# Patient Record
Sex: Female | Born: 1955 | Race: White | Hispanic: No | Smoking: Former smoker
Health system: Southern US, Community
[De-identification: ages and names within clinical notes are randomized; demographics above are authoritative.]

## PROBLEM LIST (undated history)

## (undated) DIAGNOSIS — M1990 Unspecified osteoarthritis, unspecified site: Secondary | ICD-10-CM

## (undated) DIAGNOSIS — M549 Dorsalgia, unspecified: Secondary | ICD-10-CM

## (undated) DIAGNOSIS — M62838 Other muscle spasm: Secondary | ICD-10-CM

## (undated) DIAGNOSIS — Z8669 Personal history of other diseases of the nervous system and sense organs: Secondary | ICD-10-CM

## (undated) DIAGNOSIS — E785 Hyperlipidemia, unspecified: Secondary | ICD-10-CM

## (undated) DIAGNOSIS — M2550 Pain in unspecified joint: Secondary | ICD-10-CM

## (undated) DIAGNOSIS — E119 Type 2 diabetes mellitus without complications: Secondary | ICD-10-CM

## (undated) DIAGNOSIS — R0981 Nasal congestion: Secondary | ICD-10-CM

## (undated) DIAGNOSIS — E039 Hypothyroidism, unspecified: Secondary | ICD-10-CM

## (undated) DIAGNOSIS — Z860100 Personal history of colon polyps, unspecified: Secondary | ICD-10-CM

## (undated) DIAGNOSIS — F419 Anxiety disorder, unspecified: Secondary | ICD-10-CM

## (undated) DIAGNOSIS — K219 Gastro-esophageal reflux disease without esophagitis: Secondary | ICD-10-CM

## (undated) DIAGNOSIS — F32A Depression, unspecified: Secondary | ICD-10-CM

## (undated) DIAGNOSIS — B379 Candidiasis, unspecified: Secondary | ICD-10-CM

## (undated) DIAGNOSIS — I10 Essential (primary) hypertension: Secondary | ICD-10-CM

## (undated) DIAGNOSIS — D649 Anemia, unspecified: Secondary | ICD-10-CM

## (undated) DIAGNOSIS — G8929 Other chronic pain: Secondary | ICD-10-CM

## (undated) DIAGNOSIS — K5900 Constipation, unspecified: Secondary | ICD-10-CM

## (undated) DIAGNOSIS — T8859XA Other complications of anesthesia, initial encounter: Secondary | ICD-10-CM

## (undated) DIAGNOSIS — K5790 Diverticulosis of intestine, part unspecified, without perforation or abscess without bleeding: Secondary | ICD-10-CM

## (undated) DIAGNOSIS — M797 Fibromyalgia: Secondary | ICD-10-CM

## (undated) DIAGNOSIS — F329 Major depressive disorder, single episode, unspecified: Secondary | ICD-10-CM

## (undated) DIAGNOSIS — T4145XA Adverse effect of unspecified anesthetic, initial encounter: Secondary | ICD-10-CM

## (undated) HISTORY — PX: TONSILLECTOMY: SUR1361

## (undated) HISTORY — PX: BREAST SURGERY: SHX581

## (undated) HISTORY — PX: APPENDECTOMY: SHX54

## (undated) HISTORY — PX: BACK SURGERY: SHX140

## (undated) HISTORY — PX: CARPAL TUNNEL RELEASE: SHX101

## (undated) HISTORY — PX: EXCISION MORTON'S NEUROMA: SHX5013

## (undated) HISTORY — PX: ABDOMINAL EXPLORATION SURGERY: SHX538

## (undated) HISTORY — PX: KNEE ARTHROSCOPY: SUR90

## (undated) HISTORY — PX: NASAL SINUS SURGERY: SHX719

## (undated) HISTORY — PX: COLONOSCOPY: SHX174

## (undated) HISTORY — PX: JOINT REPLACEMENT: SHX530

## (undated) HISTORY — PX: OTHER SURGICAL HISTORY: SHX169

## (undated) HISTORY — PX: ESOPHAGOGASTRODUODENOSCOPY: SHX1529

---

## 1998-12-30 ENCOUNTER — Other Ambulatory Visit: Admission: RE | Admit: 1998-12-30 | Discharge: 1998-12-30 | Payer: Self-pay | Admitting: Family Medicine

## 2002-01-22 ENCOUNTER — Ambulatory Visit (HOSPITAL_BASED_OUTPATIENT_CLINIC_OR_DEPARTMENT_OTHER): Admission: RE | Admit: 2002-01-22 | Discharge: 2002-01-22 | Payer: Self-pay | Admitting: Orthopedic Surgery

## 2002-12-30 ENCOUNTER — Encounter: Admission: RE | Admit: 2002-12-30 | Discharge: 2002-12-30 | Payer: Self-pay | Admitting: Neurosurgery

## 2002-12-30 ENCOUNTER — Encounter: Admission: RE | Admit: 2002-12-30 | Discharge: 2002-12-30 | Payer: Self-pay | Admitting: Orthopedic Surgery

## 2004-03-10 ENCOUNTER — Ambulatory Visit (HOSPITAL_COMMUNITY): Admission: RE | Admit: 2004-03-10 | Discharge: 2004-03-11 | Payer: Self-pay | Admitting: Orthopaedic Surgery

## 2007-02-03 ENCOUNTER — Encounter: Admission: RE | Admit: 2007-02-03 | Discharge: 2007-02-03 | Payer: Self-pay | Admitting: Orthopaedic Surgery

## 2010-02-05 ENCOUNTER — Inpatient Hospital Stay (HOSPITAL_COMMUNITY)
Admission: RE | Admit: 2010-02-05 | Discharge: 2010-02-08 | Payer: Self-pay | Source: Home / Self Care | Attending: Orthopaedic Surgery | Admitting: Orthopaedic Surgery

## 2010-02-08 LAB — PROTIME-INR
INR: 0.99 (ref 0.00–1.49)
INR: 1.02 (ref 0.00–1.49)
INR: 1.67 — ABNORMAL HIGH (ref 0.00–1.49)
Prothrombin Time: 13.3 seconds (ref 11.6–15.2)
Prothrombin Time: 13.6 seconds (ref 11.6–15.2)
Prothrombin Time: 19.9 seconds — ABNORMAL HIGH (ref 11.6–15.2)

## 2010-02-08 LAB — CBC
HCT: 41.7 % (ref 36.0–46.0)
HCT: 37.7 % (ref 36.0–46.0)
HCT: 35.5 % — ABNORMAL LOW (ref 36.0–46.0)
Hemoglobin: 14.2 g/dL (ref 12.0–15.0)
Hemoglobin: 12.3 g/dL (ref 12.0–15.0)
Hemoglobin: 11.6 g/dL — ABNORMAL LOW (ref 12.0–15.0)
MCH: 31.1 pg (ref 26.0–34.0)
MCH: 30.4 pg (ref 26.0–34.0)
MCH: 30.6 pg (ref 26.0–34.0)
MCHC: 34.1 g/dL (ref 30.0–36.0)
MCHC: 32.6 g/dL (ref 30.0–36.0)
MCHC: 32.7 g/dL (ref 30.0–36.0)
MCV: 91.2 fL (ref 78.0–100.0)
MCV: 93.1 fL (ref 78.0–100.0)
MCV: 93.7 fL (ref 78.0–100.0)
Platelets: 269 10*3/uL (ref 150–400)
Platelets: 240 10*3/uL (ref 150–400)
Platelets: 241 10*3/uL (ref 150–400)
RBC: 4.57 MIL/uL (ref 3.87–5.11)
RBC: 4.05 MIL/uL (ref 3.87–5.11)
RBC: 3.79 MIL/uL — ABNORMAL LOW (ref 3.87–5.11)
RDW: 13.4 % (ref 11.5–15.5)
RDW: 13.6 % (ref 11.5–15.5)
RDW: 13.4 % (ref 11.5–15.5)
WBC: 12 10*3/uL — ABNORMAL HIGH (ref 4.0–10.5)
WBC: 13.3 10*3/uL — ABNORMAL HIGH (ref 4.0–10.5)
WBC: 13.3 10*3/uL — ABNORMAL HIGH (ref 4.0–10.5)

## 2010-02-08 LAB — COMPREHENSIVE METABOLIC PANEL
ALT: 40 U/L — ABNORMAL HIGH (ref 0–35)
AST: 34 U/L (ref 0–37)
Albumin: 4.1 g/dL (ref 3.5–5.2)
Alkaline Phosphatase: 70 U/L (ref 39–117)
BUN: 7 mg/dL (ref 6–23)
CO2: 30 mEq/L (ref 19–32)
Calcium: 9.8 mg/dL (ref 8.4–10.5)
Chloride: 104 mEq/L (ref 96–112)
Creatinine, Ser: 0.72 mg/dL (ref 0.4–1.2)
GFR calc Af Amer: >60 mL/min (ref >60)
GFR calc non Af Amer: >60 mL/min (ref >60)
Glucose, Bld: 88 mg/dL (ref 70–99)
Potassium: 4.4 mEq/L (ref 3.5–5.1)
Sodium: 142 mEq/L (ref 135–145)
Total Bilirubin: 0.5 mg/dL (ref 0.3–1.2)
Total Protein: 6.7 g/dL (ref 6.0–8.3)

## 2010-02-08 LAB — BASIC METABOLIC PANEL
BUN: 6 mg/dL (ref 6–23)
BUN: 5 mg/dL — ABNORMAL LOW (ref 6–23)
CO2: 25 mEq/L (ref 19–32)
CO2: 32 mEq/L (ref 19–32)
Calcium: 8.7 mg/dL (ref 8.4–10.5)
Calcium: 9.1 mg/dL (ref 8.4–10.5)
Chloride: 103 mEq/L (ref 96–112)
Chloride: 103 mEq/L (ref 96–112)
Creatinine, Ser: 0.68 mg/dL (ref 0.4–1.2)
Creatinine, Ser: 0.7 mg/dL (ref 0.4–1.2)
GFR calc Af Amer: >60 mL/min (ref >60)
GFR calc Af Amer: >60 mL/min (ref >60)
GFR calc non Af Amer: >60 mL/min (ref >60)
GFR calc non Af Amer: >60 mL/min (ref >60)
Glucose, Bld: 137 mg/dL — ABNORMAL HIGH (ref 70–99)
Glucose, Bld: 120 mg/dL — ABNORMAL HIGH (ref 70–99)
Potassium: 4.2 mEq/L (ref 3.5–5.1)
Potassium: 4.1 mEq/L (ref 3.5–5.1)
Sodium: 137 mEq/L (ref 135–145)
Sodium: 141 mEq/L (ref 135–145)

## 2010-02-08 LAB — SURGICAL PCR SCREEN
MRSA, PCR: NEGATIVE
Staphylococcus aureus: NEGATIVE

## 2010-02-10 LAB — GLUCOSE, CAPILLARY
Glucose-Capillary: 134 mg/dL — ABNORMAL HIGH (ref 70–99)
Glucose-Capillary: 125 mg/dL — ABNORMAL HIGH (ref 70–99)

## 2010-02-10 LAB — PROTIME-INR
INR: 2.45 — ABNORMAL HIGH (ref 0.00–1.49)
Prothrombin Time: 26.7 seconds — ABNORMAL HIGH (ref 11.6–15.2)

## 2010-03-09 NOTE — Discharge Summary (Signed)
[name], [name]              ACCOUNT NO.:  [id number]  MEDICAL RECORD NO.:  [id number]          PATIENT TYPE:  INP  LOCATION:  5032                         FACILITY:  [facility]  PHYSICIAN:  Deandrea Rion C. [name], M.D.    DATE OF BIRTH:  12-14-1955  DATE OF ADMISSION:  02/05/2010 DATE OF DISCHARGE:  02/08/2010                              DISCHARGE SUMMARY   ADMISSION DIAGNOSES: 1. Right knee osteoarthritis with valgus deformity. 2. History of migraine headaches. 3. Sleep apnea. 4. Gastroesophageal reflux disease.  DISCHARGE DIAGNOSES: 1. Right knee osteoarthritis with valgus deformity. 2. History of migraine headaches. 3. Sleep apnea. 4. Gastroesophageal reflux disease. 5. Posthemorrhagic anemia.  PROCEDURE:  On February 05, 2010, the patient underwent right total knee arthroplasty, cemented, computer-assisted, performed by Dr. [name], assisted by Maud Deed, PA-C, under general anesthesia.  CONSULTATIONS:  None. BRIEF HISTORY:  The patient is a 55 year old female with chronic and progressive right knee pain secondary to osteoarthritis.  She has failed conservative treatment including anti-inflammatory medications, intra- articular steroid injections, as well as viscosupplementation.  It was felt she would benefit from surgical intervention.  Radiographs did show tricompartmental changes of the right knee, and the patient was noted to have a valgus deformity on physical examination.  BRIEF HOSPITAL COURSE:  The patient was admitted and underwent the procedure as stated above.  She tolerated the procedure without complications.  Postoperatively, neurovascular motor function of the lower extremities was noted to be intact.  Dressing change was done on the second postoperative day and daily after that with no wound drainage.  The patient was placed on Coumadin for DVT prophylaxis postoperatively.  Adjustments in Coumadin dose made according to daily protimes by the pharmacist.   Physical therapy was initiated for range of motion, stretching, strengthening exercises.  She was allowed weightbearing as tolerated on the operative extremity.  The patient was able to progress well with her activity.  PCA analgesics were utilized initially and she was weaned to p.o. analgesics without difficulty.  The patient was voiding well following discontinuation of her Foley catheter.  The patient was taking a regular diet prior to discharge.  PERTINENT LABORATORY VALUES:  Admission CBC with hemoglobin 14.2, hematocrit 41.7.  Hemoglobin dropped to 11.6 with hematocrit 35.5.  INR at discharge 2.45.  Chemistry studies on admission within normal limits. Repeat postoperatively also within normal limits.  PCR screening is negative, both for MRSA and staph.  PLAN:  Arrangements made for the patient to be discharged to home with home health physical therapy assistance through [facility].  She will change her dressing as needed and keep her wound dry and covered at all times.  Knee immobilizer to be utilized for ambulation.  She will use over-the-counter stool softeners daily while taking narcotic medications.  She will continue to use a walker and all durable medical equipment was made available to the patient prior to discharge.  She will follow up with Dr. [name] 2 weeks from the date of surgery.  MEDICATIONS AT DISCHARGE:  The patient was given prescription for Coumadin per Pharmacy, Robaxin 500 mg one every 6-8 hours as needed for spasm, Percocet  5/325 one to two every 4-6 hours as needed for pain, oxycodone 20 mg p.o. b.i.d.  She will resume all home medications as taken prior to admission.  The patient was instructed to call the office should she have questions or concerns prior to her return office visit.  CONDITION ON DISCHARGE:  Stable.     Wende Neighbors, P.A.   ______________________________ Veverly Fells [name], M.D.    SMV/MEDQ  D:  02/24/2010  T:  02/25/2010  Job:   [id number]  Electronically Signed by Maud Deed P.A. on 02/25/2010 12:20:14 PM Electronically Signed by Annell Greening M.D. on 03/09/2010 04:56:11 PM

## 2010-06-11 NOTE — Op Note (Signed)
[name], [name]                          ACCOUNT NO.:  [id number]   MEDICAL RECORD NO.:  [id number]                   PATIENT TYPE:  AMB   LOCATION:  DSC                                  FACILITY:  [facility]   PHYSICIAN:  Katy Fitch. Naaman Plummer., M.D.          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   OPERATION:  1. Release of right transverse carpal ligament, 64721-RT.  2. Release of left transverse carpal ligament, 64721-LT.  Note that these are separate procedures through separate incisions.   SURGEON:  Katy Fitch. Sypher, M.D.   ASSISTANT:  Jonni Sanger, P.A.-C.   ANESTHESIA:  General by LMA, supervising anesthesiologist, Bedelia Person, M.D.   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 Clinical examination  revealed signs of median entrapment neuropathy, consistent with carpal  tunnel syndrome.   Due to a failure to respond to nonoperative measures and with documentation  of bilateral positive nerve conduction studies, she was brought to the  operating room at this time for release of her right and left transverse  carpal ligaments.   DESCRIPTION OF PROCEDURE:  The patient was brought to the operating room and  placed in the supine position upon the operating table. Following induction  of general anesthesia, the right and left arms were prepped with Betadine  soap and solution and sterilely draped. Pneumatic tourniquets were applied  to the  proximal forearm. Following exsanguination of the right arm with an  Esmarch bandage the arterial tourniquet was inflated to 220 mmHg.   The procedure commenced with a short incision in the line of the ring finger  and the palm. The subcutaneous tissues were carefully divided in the normal  palmar fashion. This was split longitudinally through the common sensory  branch of the median nerve. These were followed by to the transverse carpal  ligament which was carefully separated from the median nerve.   The ligament was isolated and split along its ulnar border, extending into  the distal forearm. This widely opened the carpal canal. No masses or other  predicaments were noted other than thickening of the ulnar bursa.   The wound was then repaired with intradermal 3-0 Prolene. Then 0.25%  Marcaine was infiltrated along the margins of the wound for postoperative  analgesia followed by dressing with a Steri-Strip, sterile gauze, sterile  Webril and a volar plaster splint to main the wrist in 5 degrees of  dorsiflexion. The tourniquet  was released on the right side with immediate  capillary refill.   Attention was then directed to the left hand, where an identical procedure  was performed with exsanguination followed by the inflation of the arterial  tourniquet to 220 mmHg. A short incision was fashioned in the line of the  ring finger and the palm, followed  by identification of the common sensory  branches in the palm.   The transverse carpal ligament was isolated and split with scissors along  its ulnar border, extending into the distal forearm. This widely opened the  carpal canal. Once again there were no masses or other predicaments.   Bleeding points along the margin of the released skin and transverse carpal  ligament were electrocauterized with bipolar current, followed by repair  of  the skin with intradermal 3-0 Prolene. A compressive dressing was applied  once again with  Steri-Strip, sterile gauze, sterile Webril and a volar  plaster splint.   The patient was awakened from anesthesia and transferred to the recovery  room in stable condition. For aftercare she was given a prescription for  Percocet 5/325 1 to 2 tablets p.o. q.4-6h. p.r.n. pain, #20 tablets without  refill. She has been instructed to keep her hands elevated and her dressings  dry. She will return to the hospital office for a followup in 7 to 10 days  for a dressing change and advancement to her exercise program.                                               Katy Fitch. Naaman Plummer., M.D.    RVS/MEDQ  D:  01/22/2002  T:  01/22/2002  Job:  [id number]

## 2010-06-11 NOTE — Op Note (Signed)
NAMEBONNEY, BERRES NO.:  [id number]   MEDICAL RECORD NO.:  [id number]          PATIENT TYPE:  OIB   LOCATION:  2867                         FACILITY:  [facility]   PHYSICIAN:  Mark C. [name], M.D.    DATE OF BIRTH:  April 02, 1955   DATE OF PROCEDURE:  03/10/2004  DATE OF DISCHARGE:                                 OPERATIVE REPORT   PREOPERATIVE DIAGNOSIS:  Left L5-S1 HNP.   POSTOPERATIVE DIAGNOSIS:  Left L5-S1 HNP.   PROCEDURE:  Left L5-S1 microdiskectomy.   SURGEON:  Mark C. [name], M.D.   ANESTHESIA:  GOT.   ESTIMATED BLOOD LOSS:  150 mL.   DESCRIPTION OF PROCEDURE:  After induction of general anesthesia with  orotracheal intubation and preoperative Ancef prophylaxis, the patient was  placed on chest rolls in the prone position.  Back was prepped with  Duraprep, the area squared with towels, Betadine Vi-drape applied and  laminectomy sheet.  Crosstable lateral x-ray confirmed the L5-S1 level.  After left L5 laminotomy, there was some overhanging spurs off the facet  that were removed out to the level of the pedicle.  The nerve root was  displaced dorsally and laterally and underneath it was a combination of  fresh disk material as well as old calcified disk material.  It was  difficult to remove the calcified disk pieces since it was so large,  pressing on the nerve root, and had to gradually be removed from underneath  at the base, nibbling away in order to remove the pieces until it could be  separated from the undersurface of the nerve root and then removed.  The  patient had history of some severe back problems two to three years ago and  had some fresh disk material immediately underneath it which was pressing  the calcified disk fragment up against the nerve.  Continued dissection was  performed using a combination of regular hockey stick and long hockey stick.  The footed impacter with mallet and the Epstein curets.  Chunks of disk were  gently removed  until nerve root was completely decompressed.  With palpation  with the long hockey stick, there was still some calcified disk material on  the opposite side, but the patient had unilateral symptoms on the left.  After thorough irrigation, the dura was intact.  Operative field was  irrigated.  The fascia was closed with 0 Vicryl, 2-0 in the subcutaneous  tissue, skin stapled closure, and postoperative dressing.  The patient  tolerated the procedure well and was transferred to the recovery room in  stable condition.      MCY/MEDQ  D:  03/10/2004  T:  03/10/2004  Job:  [id number]

## 2010-08-31 ENCOUNTER — Encounter (HOSPITAL_COMMUNITY)
Admission: RE | Admit: 2010-08-31 | Discharge: 2010-08-31 | Disposition: A | Discharge status: Home / Self Care | Payer: BC Managed Care – PPO | Source: Ambulatory Visit | Attending: Orthopaedic Surgery | Admitting: Orthopaedic Surgery

## 2010-08-31 LAB — URINALYSIS, ROUTINE W REFLEX MICROSCOPIC
Bilirubin Urine: NEGATIVE
Glucose, UA: NEGATIVE mg/dL
Hgb urine dipstick: NEGATIVE
Ketones, ur: NEGATIVE mg/dL
Leukocytes, UA: NEGATIVE
Nitrite: NEGATIVE
Protein, ur: NEGATIVE mg/dL
Specific Gravity, Urine: 1.022 (ref 1.005–1.030)
Urobilinogen, UA: 1 mg/dL (ref 0.0–1.0)
pH: 5.5 (ref 5.0–8.0)

## 2010-08-31 LAB — COMPREHENSIVE METABOLIC PANEL
ALT: 17 U/L (ref 0–35)
AST: 17 U/L (ref 0–37)
Albumin: 3.9 g/dL (ref 3.5–5.2)
Alkaline Phosphatase: 85 U/L (ref 39–117)
BUN: 8 mg/dL (ref 6–23)
CO2: 28 mEq/L (ref 19–32)
Calcium: 9.6 mg/dL (ref 8.4–10.5)
Chloride: 105 mEq/L (ref 96–112)
Creatinine, Ser: 0.63 mg/dL (ref 0.50–1.10)
GFR calc Af Amer: >60 mL/min (ref >60)
GFR calc non Af Amer: >60 mL/min (ref >60)
Glucose, Bld: 85 mg/dL (ref 70–99)
Potassium: 4.2 mEq/L (ref 3.5–5.1)
Sodium: 143 mEq/L (ref 135–145)
Total Bilirubin: 0.2 mg/dL — ABNORMAL LOW (ref 0.3–1.2)
Total Protein: 7.3 g/dL (ref 6.0–8.3)

## 2010-08-31 LAB — CBC
HCT: 38.4 % (ref 36.0–46.0)
Hemoglobin: 13.1 g/dL (ref 12.0–15.0)
MCH: 30.8 pg (ref 26.0–34.0)
MCHC: 34.1 g/dL (ref 30.0–36.0)
MCV: 90.1 fL (ref 78.0–100.0)
Platelets: 297 10*3/uL (ref 150–400)
RBC: 4.26 MIL/uL (ref 3.87–5.11)
RDW: 13.8 % (ref 11.5–15.5)
WBC: 10.9 10*3/uL — ABNORMAL HIGH (ref 4.0–10.5)

## 2010-08-31 LAB — SEDIMENTATION RATE: Sed Rate: 22 mm/hr (ref 0–22)

## 2010-08-31 LAB — PROTIME-INR
INR: 1.01 (ref 0.00–1.49)
Prothrombin Time: 13.5 seconds (ref 11.6–15.2)

## 2010-09-01 ENCOUNTER — Other Ambulatory Visit: Payer: Self-pay | Admitting: Orthopaedic Surgery

## 2010-09-01 ENCOUNTER — Inpatient Hospital Stay (HOSPITAL_COMMUNITY)
Admission: RE | Admit: 2010-09-01 | Discharge: 2010-09-03 | DRG: 559 | Disposition: A | Discharge status: Home Health | Payer: BC Managed Care – PPO | Source: Ambulatory Visit | Attending: Orthopaedic Surgery | Admitting: Orthopaedic Surgery

## 2010-09-01 DIAGNOSIS — F17200 Nicotine dependence, unspecified, uncomplicated: Secondary | ICD-10-CM | POA: Diagnosis present

## 2010-09-01 DIAGNOSIS — J4489 Other specified chronic obstructive pulmonary disease: Secondary | ICD-10-CM | POA: Diagnosis present

## 2010-09-01 DIAGNOSIS — Z01812 Encounter for preprocedural laboratory examination: Secondary | ICD-10-CM

## 2010-09-01 DIAGNOSIS — Z96659 Presence of unspecified artificial knee joint: Secondary | ICD-10-CM

## 2010-09-01 DIAGNOSIS — K219 Gastro-esophageal reflux disease without esophagitis: Secondary | ICD-10-CM | POA: Diagnosis present

## 2010-09-01 DIAGNOSIS — G4730 Sleep apnea, unspecified: Secondary | ICD-10-CM | POA: Diagnosis present

## 2010-09-01 DIAGNOSIS — Y831 Surgical operation with implant of artificial internal device as the cause of abnormal reaction of the patient, or of later complication, without mention of misadventure at the time of the procedure: Secondary | ICD-10-CM | POA: Diagnosis present

## 2010-09-01 DIAGNOSIS — M009 Pyogenic arthritis, unspecified: Secondary | ICD-10-CM | POA: Diagnosis present

## 2010-09-01 DIAGNOSIS — Z79899 Other long term (current) drug therapy: Secondary | ICD-10-CM

## 2010-09-01 DIAGNOSIS — J449 Chronic obstructive pulmonary disease, unspecified: Secondary | ICD-10-CM | POA: Diagnosis present

## 2010-09-01 DIAGNOSIS — T8450XA Infection and inflammatory reaction due to unspecified internal joint prosthesis, initial encounter: Principal | ICD-10-CM | POA: Diagnosis present

## 2010-09-01 DIAGNOSIS — F411 Generalized anxiety disorder: Secondary | ICD-10-CM | POA: Diagnosis present

## 2010-09-01 LAB — C-REACTIVE PROTEIN: CRP: 0.6 mg/dL — ABNORMAL HIGH (ref <0.6)

## 2010-09-02 DIAGNOSIS — Y831 Surgical operation with implant of artificial internal device as the cause of abnormal reaction of the patient, or of later complication, without mention of misadventure at the time of the procedure: Secondary | ICD-10-CM

## 2010-09-02 DIAGNOSIS — T8450XA Infection and inflammatory reaction due to unspecified internal joint prosthesis, initial encounter: Secondary | ICD-10-CM

## 2010-09-03 LAB — HIV ANTIBODY (ROUTINE TESTING W REFLEX): HIV: NONREACTIVE

## 2010-09-03 LAB — CBC
HCT: 34.7 % — ABNORMAL LOW (ref 36.0–46.0)
MCH: 30.1 pg (ref 26.0–34.0)
MCHC: 32.6 g/dL (ref 30.0–36.0)
MCV: 92.5 fL (ref 78.0–100.0)
Platelets: 263 10*3/uL (ref 150–400)
RDW: 13.6 % (ref 11.5–15.5)
WBC: 10.5 10*3/uL (ref 4.0–10.5)

## 2010-09-03 LAB — BASIC METABOLIC PANEL
BUN: 6 mg/dL (ref 6–23)
Calcium: 8.8 mg/dL (ref 8.4–10.5)
Creatinine, Ser: 0.56 mg/dL (ref 0.50–1.10)
GFR calc Af Amer: >60 mL/min (ref >60)
GFR calc non Af Amer: >60 mL/min (ref >60)

## 2010-09-05 LAB — BODY FLUID CULTURE: Culture: NO GROWTH

## 2010-09-12 NOTE — Consult Note (Signed)
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  DISCUSSION:  For details, please see the handwritten note in the paper chart as written by my resident, Dr. Fayrene Fearing.  I have examined the electronic paper medical record including the history of present illness, past medical history, past surgical history, family history, social history, medications, allergies, 12-point review of systems.  I have examined the patient.  I agreed with assessment and plan as outlined in his note with the following addendum:  Briefly, this is a 55 year old Caucasian female with a past medical history significant for migraine headaches, right knee osteoarthritis who underwent total knee arthroplasty on February 05, 2010, by Dr. [name]. Apparently, postoperatively, she complained of swelling and pain in the right knee and this persisted for many months.  She has had at least 2 attempts at knee aspiration, one which was a dry tap, but second one which had yielded some serosanguineous fluid which was sent off for analysis and culture.  The patient also reportedly did have a three- phase bone scan which lit up as well concerning for infection.  The patient ultimately brought to the hospital and underwent diagnostic and operative arthroscopy of the right knee under anesthesia on September 01, 2010.  Cultures were taken intraoperatively prior to antibiotics being given and antibiotics vancomycin beads were placed.  The patient was placed postoperatively on vancomycin and Zosyn.  We are asked to assist in management and workup of this patient with prosthetic knee  infection.  IMPRESSION AND RECOMMENDATIONS: 1. Prosthetic knee infection:  This patient presents with a mixture of     data, some of which suggests infection, some of which is not     conclusive.  I certainly would want to treat her for infection, but     she does not have gross purulent material in the joint.  Her sed     rate, C-reactive protein are not terribly remarkable.  I do have     access to the three-phase bone scan that was performed, but with     that being abnormal, with her persistent pain, I certainly would     treat her.  For now, I would give her more narrow antibiotic     therapy with vancomycin to treat, more difficult to culture the     pathogen such as coag-negative staph and diphtheroids.  We can     consider addition of other drugs such as a beta-lactam such as     Rocephin to cover for microaerophilic strep, although these species     would likely also be covered reasonably well with vancomycin.  I     will add rifampin given the presence of hardware still.  I would     treat her for 6 weeks with vancomycin and rifampin, presuming that  her cultures were all negative.  We will need also to touch base     with  however, as the initial aspirates were sent to     make sure these are not yet growing any organism.  Screening was     for human immunodeficiency virus. 2. Proton pump inhibitor use.  The patient has been on Protonix     chronically for acid reflux.  I have informed her     the risks of broad-spectrum antibiotics and risks of PPIs and she     would like to transition off the PPI for now and be on Pepcid,     which I have changed in the chart.  Thank you for Infectious Disease consultation.     Acey Lav, MD     CV/MEDQ  D:  09/02/2010  T:  09/03/2010  Job:  [id number]  Electronically Signed by Paulette Blanch DAM MD on 09/12/2010 11:32:57 AM

## 2010-09-13 NOTE — Op Note (Signed)
[name], LEHRMANN NO.:  [id number]  MEDICAL RECORD NO.:  [id number]  LOCATION:  5013                         FACILITY:  [facility]  PHYSICIAN:  Ronzell Laban C. [name], M.D.    DATE OF BIRTH:  1955-10-17  DATE OF PROCEDURE:  09/01/2010 DATE OF DISCHARGE:                              OPERATIVE REPORT   PREOPERATIVE DIAGNOSIS:  Presumptive right total knee arthroplasty postop infection.  POSTOPERATIVE DIAGNOSIS:  Presumptive right total knee arthroplasty postop infection.  PROCEDURES:  Diagnostic and operative arthroscopy, right knee exam under anesthesia, biopsies and arthroscopic synovectomy and placement of nonbiodegradable antibiotic beads, vancomycin with PALACOS cement.  SURGEON:  Jyla Hopf C. [name], MD  ANESTHESIA:  General.  TOURNIQUET TIME:  Thirty seven minutes under anesthesia plus Marcaine 20 mL local.  DESCRIPTION OF PROCEDURE:  After induction of general anesthesia, proximal thigh tourniquet, no preoperative antibiotics were given. Standard DuraPrep was used, the usual impervious stockinette, Coban, and arthroscopic sheets, drapes, and towels were applied.  Inflow was placed through the superolateral portal and before any inflow was made, a pituitary was introduced into the joint and the suprapatellar pouch and multiple bites were taken of the synovium and the suprapatellar pouch. Next, the knee was insufflated.  Medial and lateral parapatellar tendon portals were used for scope and probe placement, and inspection of the knee showed a white synovium.  There was absence of type of changes that would suggest chronic tenosynovitis.  The capsule did appear to be thickened and [place] 4.5 shaver was introduced and synovectomy was performed in the suprapatellar pouch, gutters anterior to the post. Inspection at the poly-tray interface was performed.  The soft tissues were debrided anterior to the tray.  Inspection of the bone arthroscopically appeared  normal.  Synovium was removed in the suprapatellar pouch directly above the cortex of the femur.  Again, inspection did not reveal any abnormalities.  The bone was probed with the arthroscopic probe and appeared normal.  There were some strands of fibrous tissue in the gutters which were debrided, but overall most difficult to say if this joint really looked like there were chronic synovitis changes present grossly.  The patient had had preoperative lab work performed with a CRP of 5.1 which is slightly elevated.  Her sed rate initially was 6 and at the time of admission, her sed rate had been repeated and it was 22, but her CRP on hospital range was 0.6 and white count had gone from 8000 to 10.9 thousand.  At the very beginning of the case, before introduction, after the skin had been anesthetized, a stab incision was made.  Needle was introduced to see if there was any significant fluid and after the trocar was introduced, there was minimal amount of bloody fluid, and aerobic culture was obtained at that point. No purulence was encountered with inspection of the knee or when portals were opened, medial and lateral parapatellar.  Synovectomy was performed in the gutters and suprapatellar pouch.  Knee was brought out and manipulated out into full extension to make sure that there was no soft tissue anteriorly that was blocking full extension.  There was no scar tissue around the post.  Collateral  ligaments were stable.  AP stability of the knee was good.  After synovectomy, a string of PALACOS and vancomycin beads were packed very small, so that they fit through the 8- mm cannula, packed in the suprapatellar pouch and then the tip was left running out through the knee superolaterally and a single nylon suture was placed in the skin, tied in above for later closure.  Instrument and needle count was correct.  A total of 31 beads were placed into the knee and knots were tied on the end of #2  Ethibond suture.     [name] C. [name], M.D.     MCY/MEDQ  D:  09/01/2010  T:  09/02/2010  Job:  [id number]  Electronically Signed by Annell Greening M.D. on 09/13/2010 04:47:34 PM

## 2010-10-14 NOTE — Discharge Summary (Signed)
[name], BRUNKHORST NO.:  [id number]  MEDICAL RECORD NO.:  [id number]  LOCATION:  5013                         FACILITY:  [facility]  PHYSICIAN:  Coretha Creswell C. [name], M.D.    DATE OF BIRTH:  1955/04/12  DATE OF ADMISSION:  09/01/2010 DATE OF DISCHARGE:  09/03/2010                              DISCHARGE SUMMARY   ADMISSION DIAGNOSIS:  Presumptive right total knee arthroplasty postop infection.  DISCHARGE DIAGNOSIS:  Right total knee arthroplasty postop infection, treated with IV vancomycin and p.o. Rocephin.  PROCEDURE:  On September 01, 2010, the patient underwent diagnostic and operative arthroscopy, right knee exam under anesthesia, biopsies and arthroscopic synovectomy with placement of non-biodegradable antibiotic beads, vancomycin with Palacos cement.  This was performed by Dr. [name] under general anesthesia.  CONSULTATIONS:  Infectious Disease Dr. Daiva Eves.  BRIEF HISTORY:  The patient is a 55 year old female status post right knee arthroplasty, August 06, 2010.  Postoperatively, she had recurring pain and swelling.  Attempts at knee aspiration showed dry tap x2.  On the day of admission, she had been seen at Dr. [name] office, at which time, small amount of serosanguinous fluid was obtained and sent for culture and sensitivity.  The patient also had a three-phase bone scan which was concerning for infection.  She was admitted to the hospital for the above-stated procedure.  BRIEF HOSPITAL COURSE:  The patient tolerated the procedure under general anesthesia.  She was placed on IV vancomycin.  PICC line was placed.  She was placed on p.o. Rocephin.  The patient was treated with postoperative analgesics including IV PCA and oral medications. Physical therapy was consulted for range of motion and strengthening exercises as well as gait training.  The antibiotic beads were pulled at bedside prior to discharge.  The patient had no signs or symptoms of wound breakdown.   She was discharged to home on September 03, 2010.  At that time, arrangements made for home health physical therapy.  Also arrangements for home health nurse for treatment with IV vancomycin. She is plan to be on vancomycin 6 weeks total from September 01, 2010.  PERTINENT LABORATORY VALUES:  WBC 10.5 on admission, hemoglobin 11.3, hematocrit 34.7 platelets 263.  Chemistry studies with glucose 127, otherwise normal.  HIV nonreactive.  Culture showed no growth in 3 days. Prior to admission, the patient's labs had shown sed rate of 6, CRP of 5.1.  PLAN:  The patient was discharged to her home.  Arrangements for home health physical therapy as well as a home health nurse.  She will change her dressing daily or as needed.  She should follow up with Dr. [name] in 1 week.  She will call if she has increased redness or pain around the knee or if she has elevated temperatures or fever or chills.  She may continue walking as tolerated and work on range of motion stretching and strengthening exercises for her knee.  MEDICATIONS AT DISCHARGE:  The patient was given prescriptions for Pepcid 20 mg p.o. daily, oxycodone 5/325 one to two every 4-6 hours as needed for pain, rifampin 300 mg p.o. b.i.d., vancomycin 1500 mg b.i.d. She will continue with home medications of Adderall  XR 15 mg daily, citalopram 40 mg daily, methocarbamol 500 mg every 6-8 hours as needed for spasm, Crestor 10 mg daily at bedtime, Astelin nasal spray twice daily as needed.  She will stop her Protonix as well as her hydrocodone. She may continue her over-the-counter grape seed extract.  She is advised to call Dr. [name] office should there be questions or concerns prior to her return office visit.  CONDITION ON DISCHARGE:  Stable.     Wende Neighbors, P.A.   ______________________________ Veverly Fells [name], M.D.    SMV/MEDQ  D:  10/07/2010  T:  10/07/2010  Job:  [id number]  Electronically Signed by Maud Deed P.A. on  10/08/2010 03:38:11 PM Electronically Signed by Annell Greening M.D. on 10/14/2010 04:20:46 PM

## 2013-04-26 ENCOUNTER — Other Ambulatory Visit: Payer: Self-pay | Admitting: Orthopedic Surgery

## 2013-05-06 ENCOUNTER — Encounter (HOSPITAL_COMMUNITY): Payer: Self-pay

## 2013-05-10 ENCOUNTER — Encounter (HOSPITAL_COMMUNITY)
Admission: RE | Admit: 2013-05-10 | Discharge: 2013-05-10 | Disposition: A | Discharge status: Home / Self Care | Payer: BC Managed Care – PPO | Source: Ambulatory Visit | Attending: Orthopedic Surgery | Admitting: Orthopedic Surgery

## 2013-05-10 ENCOUNTER — Encounter (HOSPITAL_COMMUNITY): Payer: Self-pay

## 2013-05-10 DIAGNOSIS — Z01812 Encounter for preprocedural laboratory examination: Secondary | ICD-10-CM | POA: Insufficient documentation

## 2013-05-10 DIAGNOSIS — Z01818 Encounter for other preprocedural examination: Secondary | ICD-10-CM | POA: Insufficient documentation

## 2013-05-10 HISTORY — DX: Unspecified osteoarthritis, unspecified site: M19.90

## 2013-05-10 HISTORY — DX: Essential (primary) hypertension: I10

## 2013-05-10 HISTORY — DX: Candidiasis, unspecified: B37.9

## 2013-05-10 HISTORY — DX: Constipation, unspecified: K59.00

## 2013-05-10 HISTORY — DX: Nasal congestion: R09.81

## 2013-05-10 HISTORY — DX: Depression, unspecified: F32.A

## 2013-05-10 HISTORY — DX: Gastro-esophageal reflux disease without esophagitis: K21.9

## 2013-05-10 HISTORY — DX: Pain in unspecified joint: M25.50

## 2013-05-10 HISTORY — DX: Dorsalgia, unspecified: M54.9

## 2013-05-10 HISTORY — DX: Other complications of anesthesia, initial encounter: T88.59XA

## 2013-05-10 HISTORY — DX: Diverticulosis of intestine, part unspecified, without perforation or abscess without bleeding: K57.90

## 2013-05-10 HISTORY — DX: Other muscle spasm: M62.838

## 2013-05-10 HISTORY — DX: Hyperlipidemia, unspecified: E78.5

## 2013-05-10 HISTORY — DX: Personal history of other diseases of the nervous system and sense organs: Z86.69

## 2013-05-10 HISTORY — DX: Personal history of colonic polyps: Z86.010

## 2013-05-10 HISTORY — DX: Anemia, unspecified: D64.9

## 2013-05-10 HISTORY — DX: Adverse effect of unspecified anesthetic, initial encounter: T41.45XA

## 2013-05-10 HISTORY — DX: Fibromyalgia: M79.7

## 2013-05-10 HISTORY — DX: Other chronic pain: G89.29

## 2013-05-10 HISTORY — DX: Major depressive disorder, single episode, unspecified: F32.9

## 2013-05-10 HISTORY — DX: Personal history of colon polyps, unspecified: Z86.0100

## 2013-05-10 LAB — URINALYSIS, ROUTINE W REFLEX MICROSCOPIC
BILIRUBIN URINE: NEGATIVE
GLUCOSE, UA: NEGATIVE mg/dL
Hgb urine dipstick: NEGATIVE
KETONES UR: NEGATIVE mg/dL
NITRITE: NEGATIVE
PROTEIN: NEGATIVE mg/dL
Specific Gravity, Urine: 1.021 (ref 1.005–1.030)
Urobilinogen, UA: 0.2 mg/dL (ref 0.0–1.0)
pH: 5 (ref 5.0–8.0)

## 2013-05-10 LAB — COMPREHENSIVE METABOLIC PANEL
ALT: 27 U/L (ref 0–35)
AST: 24 U/L (ref 0–37)
Albumin: 4.2 g/dL (ref 3.5–5.2)
Alkaline Phosphatase: 80 U/L (ref 39–117)
BILIRUBIN TOTAL: 0.3 mg/dL (ref 0.3–1.2)
BUN: 16 mg/dL (ref 6–23)
CO2: 23 meq/L (ref 19–32)
CREATININE: 0.78 mg/dL (ref 0.50–1.10)
Calcium: 9.9 mg/dL (ref 8.4–10.5)
Chloride: 105 mEq/L (ref 96–112)
GLUCOSE: 84 mg/dL (ref 70–99)
Potassium: 4 mEq/L (ref 3.7–5.3)
Sodium: 142 mEq/L (ref 137–147)
Total Protein: 7.3 g/dL (ref 6.0–8.3)

## 2013-05-10 LAB — TYPE AND SCREEN
ABO/RH(D): A POS
Antibody Screen: NEGATIVE

## 2013-05-10 LAB — URINE MICROSCOPIC-ADD ON

## 2013-05-10 LAB — PROTIME-INR
INR: 1.01 (ref 0.00–1.49)
Prothrombin Time: 13.1 seconds (ref 11.6–15.2)

## 2013-05-10 LAB — CBC WITH DIFFERENTIAL/PLATELET
Basophils Absolute: 0 10*3/uL (ref 0.0–0.1)
Basophils Relative: 0 % (ref 0–1)
EOS PCT: 2 % (ref 0–5)
Eosinophils Absolute: 0.2 10*3/uL (ref 0.0–0.7)
HEMATOCRIT: 40.6 % (ref 36.0–46.0)
HEMOGLOBIN: 14.1 g/dL (ref 12.0–15.0)
LYMPHS ABS: 4.2 10*3/uL — AB (ref 0.7–4.0)
LYMPHS PCT: 36 % (ref 12–46)
MCH: 32 pg (ref 26.0–34.0)
MCHC: 34.7 g/dL (ref 30.0–36.0)
MCV: 92.1 fL (ref 78.0–100.0)
MONO ABS: 0.9 10*3/uL (ref 0.1–1.0)
MONOS PCT: 8 % (ref 3–12)
Neutro Abs: 6.4 10*3/uL (ref 1.7–7.7)
Neutrophils Relative %: 54 % (ref 43–77)
Platelets: 254 10*3/uL (ref 150–400)
RBC: 4.41 MIL/uL (ref 3.87–5.11)
RDW: 13.6 % (ref 11.5–15.5)
WBC: 11.7 10*3/uL — ABNORMAL HIGH (ref 4.0–10.5)

## 2013-05-10 LAB — ABO/RH: ABO/RH(D): A POS

## 2013-05-10 LAB — APTT: aPTT: 31 seconds (ref 24–37)

## 2013-05-10 LAB — SURGICAL PCR SCREEN
MRSA, PCR: NEGATIVE
Staphylococcus aureus: NEGATIVE

## 2013-05-10 MED ORDER — CHLORHEXIDINE GLUCONATE 4 % EX LIQD: 60.0000 mL | Freq: Once | CUTANEOUS | Status: DC

## 2013-05-10 NOTE — Progress Notes (Addendum)
Saw a cardiologist several yrs ago in [place] and a stress test was done > 4918yrs ago  Denies ever having an echo/heart cath   EKG to be requested from Dr.Robert Sherral HammersRobbins  Denies CXR in past yr     Medical Md is Dr.Robert Sherral Hammersobbins

## 2013-05-10 NOTE — Pre-Procedure Instructions (Signed)
Bonnita LevanJudy M Millis  05/10/2013   Your procedure is scheduled on:  Mon, April 27 @ 7:30 AM  [facility]  at 5:30 AM.  Call this number if you have problems the morning of surgery: [phone]   Remember:   Do not eat food or drink liquids after midnight.   Take these medicines the morning of surgery with A SIP OF WATER: Augmentin(Amoxicillin),Dymista(Azelastine-if needed),Celexa(Citalopram),and Protonix(Pantoprazole)              No Goody's,BC's,Aleve,Aspirin,Ibuprofen,Fish Oil,or any Herbal Medications   Do not wear jewelry, make-up or nail polish.  Do not wear lotions, powders, or perfumes. You may wear deodorant.  Do not shave 48 hours prior to surgery.   Do not bring valuables to the hospital.  [facility] is not responsible                  for any belongings or valuables.               Contacts, dentures or bridgework may not be worn into surgery.  Leave suitcase in the car. After surgery it may be brought to your room.  For patients admitted to the hospital, discharge time is determined by your                treatment team.              Special Instructions:  [place] - Preparing for Surgery  Before surgery, you can play an important role.  Because skin is not sterile, your skin needs to be as free of germs as possible.  You can reduce the number of germs on you skin by washing with CHG (chlorahexidine gluconate) soap before surgery.  CHG is an antiseptic cleaner which kills germs and bonds with the skin to continue killing germs even after washing.  Please DO NOT use if you have an allergy to CHG or antibacterial soaps.  If your skin becomes reddened/irritated stop using the CHG and inform your nurse when you arrive at Short Stay.  Do not shave (including legs and underarms) for at least 48 hours prior to the first CHG shower.  You may shave your face.  Please follow these instructions carefully:   1.  Shower with CHG Soap the night before surgery and  the                                morning of Surgery.  2.  If you choose to wash your hair, wash your hair first as usual with your       normal shampoo.  3.  After you shampoo, rinse your hair and body thoroughly to remove the                      Shampoo.  4.  Use CHG as you would any other liquid soap.  You can apply chg directly       to the skin and wash gently with scrungie or a clean washcloth.  5.  Apply the CHG Soap to your body ONLY FROM THE NECK DOWN.        Do not use on open wounds or open sores.  Avoid contact with your eyes,       ears, mouth and genitals (private parts).  Wash genitals (private parts)       with your normal soap.  6.  Wash thoroughly, paying special attention to the area where your surgery        will be performed.  7.  Thoroughly rinse your body with warm water from the neck down.  8.  DO NOT shower/wash with your normal soap after using and rinsing off       the CHG Soap.  9.  Pat yourself dry with a clean towel.            10.  Wear clean pajamas.            11.  Place clean sheets on your bed the night of your first shower and do not        sleep with pets.  Day of Surgery  Do not apply any lotions/deoderants the morning of surgery.  Please wear clean clothes to the hospital/surgery center.     Please read over the following fact sheets that you were given: Pain Booklet, Coughing and Deep Breathing, Blood Transfusion Information, MRSA Information and Surgical Site Infection Prevention

## 2013-05-12 LAB — URINE CULTURE: Colony Count: 65000

## 2013-05-19 MED ORDER — TRANEXAMIC ACID 100 MG/ML IV SOLN
1000.0000 mg | INTRAVENOUS | Status: AC
  Administered 2013-05-20: 1000 mg via INTRAVENOUS
  Filled 2013-05-19: qty 10

## 2013-05-19 MED ORDER — CEFAZOLIN SODIUM-DEXTROSE 2-3 GM-% IV SOLR
2.0000 g | INTRAVENOUS | Status: AC
  Administered 2013-05-20: 2 g via INTRAVENOUS
  Filled 2013-05-19: qty 50

## 2013-05-20 ENCOUNTER — Inpatient Hospital Stay (HOSPITAL_COMMUNITY)
Admission: RE | Admit: 2013-05-20 | Discharge: 2013-05-21 | DRG: 470 | Disposition: A | Discharge status: Home / Self Care | Payer: BC Managed Care – PPO | Source: Ambulatory Visit | Attending: Orthopedic Surgery | Admitting: Orthopedic Surgery

## 2013-05-20 ENCOUNTER — Encounter (HOSPITAL_COMMUNITY): Payer: BC Managed Care – PPO | Admitting: Certified Registered Nurse Anesthetist

## 2013-05-20 ENCOUNTER — Encounter (HOSPITAL_COMMUNITY): Payer: Self-pay | Admitting: *Deleted

## 2013-05-20 ENCOUNTER — Inpatient Hospital Stay (HOSPITAL_COMMUNITY): Payer: BC Managed Care – PPO | Admitting: Certified Registered Nurse Anesthetist

## 2013-05-20 ENCOUNTER — Encounter (HOSPITAL_COMMUNITY)
Admission: RE | Disposition: A | Discharge status: Home / Self Care | Payer: Self-pay | Source: Ambulatory Visit | Attending: Orthopedic Surgery

## 2013-05-20 DIAGNOSIS — M1710 Unilateral primary osteoarthritis, unspecified knee: Principal | ICD-10-CM | POA: Diagnosis present

## 2013-05-20 DIAGNOSIS — K219 Gastro-esophageal reflux disease without esophagitis: Secondary | ICD-10-CM | POA: Diagnosis present

## 2013-05-20 DIAGNOSIS — M25569 Pain in unspecified knee: Secondary | ICD-10-CM | POA: Diagnosis present

## 2013-05-20 DIAGNOSIS — IMO0001 Reserved for inherently not codable concepts without codable children: Secondary | ICD-10-CM | POA: Diagnosis present

## 2013-05-20 DIAGNOSIS — IMO0002 Reserved for concepts with insufficient information to code with codable children: Principal | ICD-10-CM | POA: Diagnosis present

## 2013-05-20 DIAGNOSIS — F17200 Nicotine dependence, unspecified, uncomplicated: Secondary | ICD-10-CM | POA: Diagnosis present

## 2013-05-20 DIAGNOSIS — D62 Acute posthemorrhagic anemia: Secondary | ICD-10-CM | POA: Diagnosis not present

## 2013-05-20 DIAGNOSIS — F3289 Other specified depressive episodes: Secondary | ICD-10-CM | POA: Diagnosis present

## 2013-05-20 DIAGNOSIS — M549 Dorsalgia, unspecified: Secondary | ICD-10-CM | POA: Diagnosis present

## 2013-05-20 DIAGNOSIS — F329 Major depressive disorder, single episode, unspecified: Secondary | ICD-10-CM | POA: Diagnosis present

## 2013-05-20 DIAGNOSIS — E785 Hyperlipidemia, unspecified: Secondary | ICD-10-CM | POA: Diagnosis present

## 2013-05-20 DIAGNOSIS — I10 Essential (primary) hypertension: Secondary | ICD-10-CM | POA: Diagnosis present

## 2013-05-20 DIAGNOSIS — Z96659 Presence of unspecified artificial knee joint: Secondary | ICD-10-CM

## 2013-05-20 DIAGNOSIS — G8929 Other chronic pain: Secondary | ICD-10-CM | POA: Diagnosis present

## 2013-05-20 HISTORY — PX: TOTAL KNEE REVISION WITH SCAR DEBRIDEMENT/PATELLA REVISION WITH POLY EXCHANGE: SHX6128

## 2013-05-20 HISTORY — PX: STERIOD INJECTION: SHX5046

## 2013-05-20 LAB — CBC
HEMATOCRIT: 37.3 % (ref 36.0–46.0)
HEMOGLOBIN: 12.4 g/dL (ref 12.0–15.0)
MCH: 30.9 pg (ref 26.0–34.0)
MCHC: 33.2 g/dL (ref 30.0–36.0)
MCV: 93 fL (ref 78.0–100.0)
Platelets: 222 10*3/uL (ref 150–400)
RBC: 4.01 MIL/uL (ref 3.87–5.11)
RDW: 13.8 % (ref 11.5–15.5)
WBC: 15.6 10*3/uL — AB (ref 4.0–10.5)

## 2013-05-20 LAB — CREATININE, SERUM: CREATININE: 0.69 mg/dL (ref 0.50–1.10)

## 2013-05-20 LAB — GRAM STAIN

## 2013-05-20 SURGERY — TOTAL KNEE REVISION WITH SCAR DEBRIDEMENT/PATELLA REVISION WITH POLY EXCHANGE
Anesthesia: General | Site: Knee | Laterality: Right

## 2013-05-20 MED ORDER — DOCUSATE SODIUM 100 MG PO CAPS
100.0000 mg | ORAL_CAPSULE | Freq: Two times a day (BID) | ORAL | Status: DC
  Administered 2013-05-20 – 2013-05-21 (×3): 100 mg via ORAL
  Filled 2013-05-20 (×3): qty 1

## 2013-05-20 MED ORDER — BUPIVACAINE LIPOSOME 1.3 % IJ SUSP
20.0000 mL | Freq: Once | INTRAMUSCULAR | Status: DC
  Filled 2013-05-20: qty 20

## 2013-05-20 MED ORDER — CITALOPRAM HYDROBROMIDE 40 MG PO TABS
40.0000 mg | ORAL_TABLET | Freq: Every day | ORAL | Status: DC
  Administered 2013-05-21: 40 mg via ORAL
  Filled 2013-05-20: qty 1

## 2013-05-20 MED ORDER — ACETAMINOPHEN 650 MG RE SUPP: 650.0000 mg | Freq: Four times a day (QID) | RECTAL | Status: DC | PRN

## 2013-05-20 MED ORDER — ONDANSETRON HCL 4 MG/2ML IJ SOLN
INTRAMUSCULAR | Status: DC | PRN
  Administered 2013-05-20: 4 mg via INTRAVENOUS

## 2013-05-20 MED ORDER — BISACODYL 5 MG PO TBEC: 5.0000 mg | DELAYED_RELEASE_TABLET | Freq: Every day | ORAL | Status: DC | PRN

## 2013-05-20 MED ORDER — PROPOFOL 10 MG/ML IV BOLUS
INTRAVENOUS | Status: AC
  Filled 2013-05-20: qty 20

## 2013-05-20 MED ORDER — HYDROMORPHONE HCL PF 1 MG/ML IJ SOLN
0.2500 mg | INTRAMUSCULAR | Status: DC | PRN
  Administered 2013-05-20 (×2): 0.5 mg via INTRAVENOUS

## 2013-05-20 MED ORDER — GLYCOPYRROLATE 0.2 MG/ML IJ SOLN
INTRAMUSCULAR | Status: DC | PRN
  Administered 2013-05-20: .8 mg via INTRAVENOUS

## 2013-05-20 MED ORDER — OXYCODONE HCL 5 MG PO TABS
5.0000 mg | ORAL_TABLET | Freq: Once | ORAL | Status: AC | PRN
  Administered 2013-05-20: 5 mg via ORAL

## 2013-05-20 MED ORDER — PROPOFOL 10 MG/ML IV BOLUS
INTRAVENOUS | Status: DC | PRN
  Administered 2013-05-20: 180 mg via INTRAVENOUS

## 2013-05-20 MED ORDER — OXYCODONE HCL 5 MG PO TABS
5.0000 mg | ORAL_TABLET | ORAL | Status: DC | PRN
  Administered 2013-05-20 – 2013-05-21 (×6): 10 mg via ORAL
  Filled 2013-05-20 (×6): qty 2

## 2013-05-20 MED ORDER — MIDAZOLAM HCL 2 MG/2ML IJ SOLN
INTRAMUSCULAR | Status: AC
Start: 2013-05-20 — End: 2013-05-20
  Filled 2013-05-20: qty 2

## 2013-05-20 MED ORDER — OXYCODONE HCL ER 10 MG PO T12A
10.0000 mg | EXTENDED_RELEASE_TABLET | Freq: Two times a day (BID) | ORAL | Status: DC
  Administered 2013-05-20 – 2013-05-21 (×2): 10 mg via ORAL
  Filled 2013-05-20 (×2): qty 1

## 2013-05-20 MED ORDER — MENTHOL 3 MG MT LOZG: 1.0000 | LOZENGE | OROMUCOSAL | Status: DC | PRN

## 2013-05-20 MED ORDER — HYDROMORPHONE HCL PF 1 MG/ML IJ SOLN
INTRAMUSCULAR | Status: AC
  Filled 2013-05-20: qty 1

## 2013-05-20 MED ORDER — METOCLOPRAMIDE HCL 10 MG PO TABS
5.0000 mg | ORAL_TABLET | Freq: Three times a day (TID) | ORAL | Status: DC | PRN
Start: 2013-05-20 — End: 2013-05-21

## 2013-05-20 MED ORDER — LIDOCAINE HCL (CARDIAC) 20 MG/ML IV SOLN
INTRAVENOUS | Status: AC
  Filled 2013-05-20: qty 5

## 2013-05-20 MED ORDER — FLEET ENEMA 7-19 GM/118ML RE ENEM: 1.0000 | ENEMA | Freq: Once | RECTAL | Status: AC | PRN

## 2013-05-20 MED ORDER — TIZANIDINE HCL 4 MG PO TABS
4.0000 mg | ORAL_TABLET | Freq: Two times a day (BID) | ORAL | Status: DC
  Administered 2013-05-20 – 2013-05-21 (×3): 4 mg via ORAL
  Filled 2013-05-20 (×4): qty 1

## 2013-05-20 MED ORDER — AZELASTINE-FLUTICASONE 137-50 MCG/ACT NA SUSP: 1.0000 | Freq: Every day | NASAL | Status: DC | PRN

## 2013-05-20 MED ORDER — LACTATED RINGERS IV SOLN
INTRAVENOUS | Status: DC | PRN
  Administered 2013-05-20 (×2): via INTRAVENOUS

## 2013-05-20 MED ORDER — OXYCODONE HCL 5 MG PO TABS
ORAL_TABLET | ORAL | Status: AC
  Filled 2013-05-20: qty 1

## 2013-05-20 MED ORDER — BUPIVACAINE-EPINEPHRINE (PF) 0.25% -1:200000 IJ SOLN
INTRAMUSCULAR | Status: AC
  Filled 2013-05-20: qty 30

## 2013-05-20 MED ORDER — FENTANYL CITRATE 0.05 MG/ML IJ SOLN: 50.0000 ug | Freq: Once | INTRAMUSCULAR | Status: DC

## 2013-05-20 MED ORDER — OXYCODONE HCL 5 MG/5ML PO SOLN: 5.0000 mg | Freq: Once | ORAL | Status: AC | PRN

## 2013-05-20 MED ORDER — METHYLPREDNISOLONE ACETATE 80 MG/ML IJ SUSP
INTRAMUSCULAR | Status: AC
  Filled 2013-05-20: qty 1

## 2013-05-20 MED ORDER — PROMETHAZINE HCL 25 MG/ML IJ SOLN: 6.2500 mg | INTRAMUSCULAR | Status: DC | PRN

## 2013-05-20 MED ORDER — ONDANSETRON HCL 4 MG PO TABS: 4.0000 mg | ORAL_TABLET | Freq: Four times a day (QID) | ORAL | Status: DC | PRN

## 2013-05-20 MED ORDER — DIPHENHYDRAMINE HCL 12.5 MG/5ML PO ELIX: 12.5000 mg | ORAL_SOLUTION | ORAL | Status: DC | PRN

## 2013-05-20 MED ORDER — LIDOCAINE HCL (CARDIAC) 20 MG/ML IV SOLN
INTRAVENOUS | Status: DC | PRN
  Administered 2013-05-20: 100 mg via INTRAVENOUS

## 2013-05-20 MED ORDER — PANTOPRAZOLE SODIUM 40 MG PO TBEC
40.0000 mg | DELAYED_RELEASE_TABLET | Freq: Every day | ORAL | Status: DC
  Administered 2013-05-21: 40 mg via ORAL
  Filled 2013-05-20: qty 1

## 2013-05-20 MED ORDER — ALUM & MAG HYDROXIDE-SIMETH 200-200-20 MG/5ML PO SUSP: 30.0000 mL | ORAL | Status: DC | PRN

## 2013-05-20 MED ORDER — BUPIVACAINE-EPINEPHRINE PF 0.5-1:200000 % IJ SOLN
INTRAMUSCULAR | Status: DC | PRN
  Administered 2013-05-20: 25 mL

## 2013-05-20 MED ORDER — ATORVASTATIN CALCIUM 20 MG PO TABS
20.0000 mg | ORAL_TABLET | Freq: Every day | ORAL | Status: DC
  Administered 2013-05-20: 20 mg via ORAL
  Filled 2013-05-20 (×2): qty 1

## 2013-05-20 MED ORDER — ROCURONIUM BROMIDE 100 MG/10ML IV SOLN
INTRAVENOUS | Status: DC | PRN
  Administered 2013-05-20: 50 mg via INTRAVENOUS

## 2013-05-20 MED ORDER — BUPIVACAINE HCL (PF) 0.5 % IJ SOLN
INTRAMUSCULAR | Status: AC
  Filled 2013-05-20: qty 10

## 2013-05-20 MED ORDER — FENTANYL CITRATE 0.05 MG/ML IJ SOLN
INTRAMUSCULAR | Status: AC
  Filled 2013-05-20: qty 5

## 2013-05-20 MED ORDER — ONDANSETRON HCL 4 MG/2ML IJ SOLN
INTRAMUSCULAR | Status: AC
  Filled 2013-05-20: qty 2

## 2013-05-20 MED ORDER — GLYCOPYRROLATE 0.2 MG/ML IJ SOLN
INTRAMUSCULAR | Status: AC
  Filled 2013-05-20: qty 4

## 2013-05-20 MED ORDER — HYDROMORPHONE HCL PF 1 MG/ML IJ SOLN
1.0000 mg | INTRAMUSCULAR | Status: DC | PRN
  Administered 2013-05-20 – 2013-05-21 (×4): 1 mg via INTRAVENOUS
  Filled 2013-05-20 (×4): qty 1

## 2013-05-20 MED ORDER — SENNOSIDES-DOCUSATE SODIUM 8.6-50 MG PO TABS: 1.0000 | ORAL_TABLET | Freq: Every evening | ORAL | Status: DC | PRN

## 2013-05-20 MED ORDER — ENOXAPARIN SODIUM 30 MG/0.3ML ~~LOC~~ SOLN
30.0000 mg | Freq: Two times a day (BID) | SUBCUTANEOUS | Status: DC
  Administered 2013-05-21: 30 mg via SUBCUTANEOUS
  Filled 2013-05-20 (×3): qty 0.3

## 2013-05-20 MED ORDER — CELECOXIB 200 MG PO CAPS
200.0000 mg | ORAL_CAPSULE | Freq: Two times a day (BID) | ORAL | Status: DC
  Administered 2013-05-20 – 2013-05-21 (×3): 200 mg via ORAL
  Filled 2013-05-20 (×4): qty 1

## 2013-05-20 MED ORDER — MIDAZOLAM HCL 5 MG/5ML IJ SOLN
INTRAMUSCULAR | Status: DC | PRN
  Administered 2013-05-20 (×2): 1 mg via INTRAVENOUS

## 2013-05-20 MED ORDER — ONDANSETRON HCL 4 MG/2ML IJ SOLN: 4.0000 mg | Freq: Four times a day (QID) | INTRAMUSCULAR | Status: DC | PRN

## 2013-05-20 MED ORDER — SODIUM CHLORIDE 0.9 % IR SOLN
Status: DC | PRN
  Administered 2013-05-20: 1000 mL

## 2013-05-20 MED ORDER — FENTANYL CITRATE 0.05 MG/ML IJ SOLN
INTRAMUSCULAR | Status: DC | PRN
  Administered 2013-05-20 (×2): 50 ug via INTRAVENOUS
  Administered 2013-05-20: 25 ug via INTRAVENOUS
  Administered 2013-05-20: 50 ug via INTRAVENOUS

## 2013-05-20 MED ORDER — METHYLPREDNISOLONE ACETATE 80 MG/ML IJ SUSP
INTRAMUSCULAR | Status: DC | PRN
  Administered 2013-05-20: 80 mg

## 2013-05-20 MED ORDER — PHENOL 1.4 % MT LIQD
1.0000 | OROMUCOSAL | Status: DC | PRN
  Filled 2013-05-20: qty 177

## 2013-05-20 MED ORDER — SODIUM CHLORIDE 0.9 % IV SOLN: INTRAVENOUS | Status: DC

## 2013-05-20 MED ORDER — TOPIRAMATE 25 MG PO TABS
75.0000 mg | ORAL_TABLET | Freq: Every day | ORAL | Status: DC
  Administered 2013-05-20: 75 mg via ORAL
  Filled 2013-05-20 (×2): qty 3

## 2013-05-20 MED ORDER — CEFAZOLIN SODIUM-DEXTROSE 2-3 GM-% IV SOLR
2.0000 g | Freq: Four times a day (QID) | INTRAVENOUS | Status: AC
  Administered 2013-05-20 (×2): 2 g via INTRAVENOUS
  Filled 2013-05-20 (×2): qty 50

## 2013-05-20 MED ORDER — ROCURONIUM BROMIDE 50 MG/5ML IV SOLN
INTRAVENOUS | Status: AC
  Filled 2013-05-20: qty 1

## 2013-05-20 MED ORDER — LIDOCAINE HCL 0.5 % IJ SOLN
INTRAMUSCULAR | Status: DC | PRN
  Administered 2013-05-20: 5 mL

## 2013-05-20 MED ORDER — ACETAMINOPHEN 325 MG PO TABS: 650.0000 mg | ORAL_TABLET | Freq: Four times a day (QID) | ORAL | Status: DC | PRN

## 2013-05-20 MED ORDER — EPHEDRINE SULFATE 50 MG/ML IJ SOLN
INTRAMUSCULAR | Status: AC
  Filled 2013-05-20: qty 1

## 2013-05-20 MED ORDER — BUPIVACAINE-EPINEPHRINE 0.25% -1:200000 IJ SOLN
INTRAMUSCULAR | Status: DC | PRN
  Administered 2013-05-20: 30 mL

## 2013-05-20 MED ORDER — BUPIVACAINE LIPOSOME 1.3 % IJ SUSP
INTRAMUSCULAR | Status: DC | PRN
  Administered 2013-05-20: 20 mL

## 2013-05-20 MED ORDER — SUCCINYLCHOLINE CHLORIDE 20 MG/ML IJ SOLN
INTRAMUSCULAR | Status: AC
Start: 2013-05-20 — End: 2013-05-20
  Filled 2013-05-20: qty 1

## 2013-05-20 MED ORDER — PHENYLEPHRINE HCL 10 MG/ML IJ SOLN
INTRAMUSCULAR | Status: AC
  Filled 2013-05-20: qty 1

## 2013-05-20 MED ORDER — NEOSTIGMINE METHYLSULFATE 1 MG/ML IJ SOLN
INTRAMUSCULAR | Status: AC
  Filled 2013-05-20: qty 10

## 2013-05-20 MED ORDER — NEOSTIGMINE METHYLSULFATE 1 MG/ML IJ SOLN
INTRAMUSCULAR | Status: DC | PRN
  Administered 2013-05-20: 5 mg via INTRAVENOUS

## 2013-05-20 MED ORDER — SODIUM CHLORIDE 0.9 % IV SOLN
INTRAVENOUS | Status: DC
  Administered 2013-05-20 – 2013-05-21 (×2): via INTRAVENOUS

## 2013-05-20 MED ORDER — METOCLOPRAMIDE HCL 5 MG/ML IJ SOLN: 5.0000 mg | Freq: Three times a day (TID) | INTRAMUSCULAR | Status: DC | PRN

## 2013-05-20 MED ORDER — STERILE WATER FOR INJECTION IJ SOLN
INTRAMUSCULAR | Status: AC
  Filled 2013-05-20: qty 10

## 2013-05-20 MED ORDER — LIDOCAINE HCL (PF) 0.5 % IJ SOLN
INTRAMUSCULAR | Status: AC
  Filled 2013-05-20: qty 50

## 2013-05-20 MED ORDER — MIDAZOLAM HCL 2 MG/2ML IJ SOLN
1.0000 mg | INTRAMUSCULAR | Status: DC | PRN
Start: 2013-05-20 — End: 2013-05-20

## 2013-05-20 SURGICAL SUPPLY — 70 items
BANDAGE ESMARK 6X9 LF (GAUZE/BANDAGES/DRESSINGS) ×2 IMPLANT
BLADE 10 SAFETY STRL DISP (BLADE) ×16 IMPLANT
BLADE SAGITTAL 13X1.27X60 (BLADE) IMPLANT
BLADE SAGITTAL 13X1.27X60MM (BLADE)
BLADE SAW SGTL 13X75X1.27 (BLADE) IMPLANT
BLADE SAW SGTL 83.5X18.5 (BLADE) IMPLANT
BLADE SAW SGTL NAR THIN XSHT (BLADE) IMPLANT
BNDG CMPR 9X6 STRL LF SNTH (GAUZE/BANDAGES/DRESSINGS) ×2
BNDG ESMARK 6X9 LF (GAUZE/BANDAGES/DRESSINGS) ×4
BOWL SMART MIX CTS (DISPOSABLE) IMPLANT
COVER SURGICAL LIGHT HANDLE (MISCELLANEOUS) ×4 IMPLANT
CUFF TOURNIQUET SINGLE 34IN LL (TOURNIQUET CUFF) ×4 IMPLANT
DRAPE EXTREMITY T 121X128X90 (DRAPE) ×4 IMPLANT
DRAPE INCISE IOBAN 66X45 STRL (DRAPES) ×8 IMPLANT
DRAPE PROXIMA HALF (DRAPES) ×4 IMPLANT
DRAPE U-SHAPE 47X51 STRL (DRAPES) ×4 IMPLANT
DRSG ADAPTIC 3X8 NADH LF (GAUZE/BANDAGES/DRESSINGS) ×4 IMPLANT
DRSG PAD ABDOMINAL 8X10 ST (GAUZE/BANDAGES/DRESSINGS) ×4 IMPLANT
DURAPREP 26ML APPLICATOR (WOUND CARE) ×8 IMPLANT
ELECT REM PT RETURN 9FT ADLT (ELECTROSURGICAL) ×4
ELECTRODE REM PT RTRN 9FT ADLT (ELECTROSURGICAL) ×2 IMPLANT
EVACUATOR 1/8 PVC DRAIN (DRAIN) IMPLANT
FACESHIELD WRAPAROUND (MASK) ×4 IMPLANT
GLOVE BIOGEL M 7.0 STRL (GLOVE) ×8 IMPLANT
GLOVE BIOGEL PI IND STRL 7.5 (GLOVE) IMPLANT
GLOVE BIOGEL PI IND STRL 8.5 (GLOVE) ×2 IMPLANT
GLOVE BIOGEL PI INDICATOR 7.5 (GLOVE)
GLOVE BIOGEL PI INDICATOR 8.5 (GLOVE) ×2
GLOVE BIOGEL PI ORTHO PRO SZ8 (GLOVE) ×2
GLOVE PI ORTHO PRO STRL SZ8 (GLOVE) ×2 IMPLANT
GLOVE SURG ORTHO 8.0 STRL STRW (GLOVE) ×8 IMPLANT
GOWN STRL REUS W/ TWL LRG LVL3 (GOWN DISPOSABLE) ×4 IMPLANT
GOWN STRL REUS W/ TWL XL LVL3 (GOWN DISPOSABLE) ×4 IMPLANT
GOWN STRL REUS W/TWL LRG LVL3 (GOWN DISPOSABLE) ×8
GOWN STRL REUS W/TWL XL LVL3 (GOWN DISPOSABLE) ×8
HANDPIECE INTERPULSE COAX TIP (DISPOSABLE)
HOOD PEEL AWAY FACE SHEILD DIS (HOOD) ×12 IMPLANT
KIT BASIN OR (CUSTOM PROCEDURE TRAY) ×4 IMPLANT
KIT ROOM TURNOVER OR (KITS) ×4 IMPLANT
MANIFOLD NEPTUNE II (INSTRUMENTS) ×4 IMPLANT
NEEDLE 18GX1X1/2 (RX/OR ONLY) (NEEDLE) IMPLANT
NEEDLE 22X1 1/2 (OR ONLY) (NEEDLE) ×8 IMPLANT
NS IRRIG 1000ML POUR BTL (IV SOLUTION) ×4 IMPLANT
PACK TOTAL JOINT (CUSTOM PROCEDURE TRAY) ×4 IMPLANT
PAD ABD 8X10 STRL (GAUZE/BANDAGES/DRESSINGS) ×4 IMPLANT
PAD ARMBOARD 7.5X6 YLW CONV (MISCELLANEOUS) ×4 IMPLANT
PADDING CAST ABS 6INX4YD NS (CAST SUPPLIES) ×2
PADDING CAST ABS COTTON 6X4 NS (CAST SUPPLIES) ×2 IMPLANT
PADDING CAST COTTON 6X4 STRL (CAST SUPPLIES) ×4 IMPLANT
PLATE ROT INSERT 10MM SIZE 5 (Plate) ×4 IMPLANT
SET HNDPC FAN SPRY TIP SCT (DISPOSABLE) IMPLANT
SPONGE GAUZE 4X4 12PLY (GAUZE/BANDAGES/DRESSINGS) ×4 IMPLANT
SPONGE GAUZE 4X4 12PLY STER LF (GAUZE/BANDAGES/DRESSINGS) ×4 IMPLANT
STAPLER VISISTAT 35W (STAPLE) ×4 IMPLANT
SUCTION FRAZIER TIP 10 FR DISP (SUCTIONS) ×4 IMPLANT
SUT BONE WAX W31G (SUTURE) IMPLANT
SUT PDS AB 0 CT 36 (SUTURE) IMPLANT
SUT PDS AB 1 CT  36 (SUTURE)
SUT PDS AB 1 CT 36 (SUTURE) IMPLANT
SUT PDS AB 2-0 CT1 27 (SUTURE) ×4 IMPLANT
SUT VIC AB 0 CTB1 27 (SUTURE) ×8 IMPLANT
SUT VIC AB 1 CT1 27 (SUTURE) ×6
SUT VIC AB 1 CT1 27XBRD ANBCTR (SUTURE) ×4 IMPLANT
SUT VIC AB 2-0 CTB1 (SUTURE) IMPLANT
SWAB COLLECTION DEVICE MRSA (MISCELLANEOUS) ×4 IMPLANT
SYR 20ML ECCENTRIC (SYRINGE) IMPLANT
TOWEL OR 17X24 6PK STRL BLUE (TOWEL DISPOSABLE) ×4 IMPLANT
TOWEL OR 17X26 10 PK STRL BLUE (TOWEL DISPOSABLE) ×4 IMPLANT
TUBE ANAEROBIC SPECIMEN COL (MISCELLANEOUS) IMPLANT
WATER STERILE IRR 1000ML POUR (IV SOLUTION) ×12 IMPLANT

## 2013-05-20 NOTE — Evaluation (Signed)
Physical Therapy Evaluation Patient Details Name: [name] MRN: [id number] DOB: 07/17/1955 Today's Date: 05/20/2013   History of Present Illness  Pt admitted for R total knee poly exchange  Clinical Impression  Pt admitted with/for TKR revision/poly exchange.  Pt currently limited functionally due to the problems listed below.  (see problems list.)  Pt will benefit from PT to maximize function and safety to be able to get home safely with available assist of family and friends.     Follow Up Recommendations Home health PT;Supervision for mobility/OOB    Equipment Recommendations  None recommended by PT;Other (comment) (don't expect any needs)    Recommendations for Other Services       Precautions / Restrictions Precautions Precautions: Knee;Fall Restrictions Weight Bearing Restrictions: Yes RLE Weight Bearing: Weight bearing as tolerated      Mobility  Bed Mobility Overal bed mobility: Needs Assistance Bed Mobility: Supine to Sit     Supine to sit: Supervision     General bed mobility comments: bridged to edge and moved to EOB without assist.  Supported or guarded R leg with L  Transfers Overall transfer level: Needs assistance Equipment used: Rolling walker (2 wheeled) Transfers: Sit to/from UGI CorporationStand;Stand Pivot Transfers Sit to Stand: Min assist Stand pivot transfers: Min assist       General transfer comment: reinforced best technique; steady assist only  Ambulation/Gait Ambulation/Gait assistance: Min guard Ambulation Distance (Feet): 6 Feet (bsc to chair) Assistive device: Rolling walker (2 wheeled) Gait Pattern/deviations: Step-to pattern;Decreased step length - left;Decreased stance time - right;Decreased stride length     General Gait Details: antalgic gait, but good effort accepting weight on R LE  Stairs            Wheelchair Mobility    Modified Rankin (Stroke Patients Only)       Balance Overall balance assessment: No  apparent balance deficits (not formally assessed)                                           Pertinent Vitals/Pain 5/10 during exercise    Home Living Family/patient expects to be discharged to:: Private residence Living Arrangements: Spouse/significant other Available Help at Discharge: Friend(s);Family;Available 24 hours/day Type of Home: House Home Access: Stairs to enter   Entergy CorporationEntrance Stairs-Number of Steps: 2 Home Layout: One level Home Equipment: Walker - 2 wheels Additional Comments: has shower with built in seat, comfort height toilet; no rails in bath    Prior Function Level of Independence: Independent         Comments: working and active     Higher education careers adviserHand Dominance        Extremity/Trunk Assessment   Upper Extremity Assessment: Overall WFL for tasks assessed           Lower Extremity Assessment: RLE deficits/detail RLE Deficits / Details: general weakness, AAROM flexion 75*, AROM  45*       Communication   Communication: No difficulties  Cognition Arousal/Alertness: Awake/alert Behavior During Therapy: WFL for tasks assessed/performed Overall Cognitive Status: Within Functional Limits for tasks assessed                      General Comments      Exercises Total Joint Exercises Ankle Circles/Pumps: AROM;Other reps (comment);Both;Seated (40 reps) Quad Sets: AROM;Both;10 reps;Supine Heel Slides: AAROM;10 reps;Right Goniometric ROM: subjective AAROM 75*  Assessment/Plan    PT Assessment Patient needs continued PT services  PT Diagnosis Difficulty walking;Acute pain   PT Problem List Decreased strength;Decreased mobility;Decreased knowledge of use of DME;Pain;Decreased activity tolerance  PT Treatment Interventions DME instruction;Gait training;Stair training;Functional mobility training;Therapeutic activities;Patient/family education   PT Goals (Current goals can be found in the Care Plan section) Acute Rehab PT  Goals Patient Stated Goal: active again without pain PT Goal Formulation: With patient Time For Goal Achievement: 05/27/13 Potential to Achieve Goals: Good    Frequency 7X/week   Barriers to discharge        Co-evaluation               End of Session   Activity Tolerance: Patient tolerated treatment well Patient left: in chair;with call bell/phone within reach Nurse Communication: Mobility status         Time: 1720-1750 PT Time Calculation (min): 30 min   Charges:   PT Evaluation $Initial PT Evaluation Tier I: 1 Procedure PT Treatments $Therapeutic Exercise: 8-22 mins $Therapeutic Activity: 8-22 mins   PT G CodesEliseo Gum:          Jatavis Malek V Lynnmarie Lovett 05/20/2013, 6:03 PM 05/20/2013   BingKen Ondria Oswald, PT [phone]  (pager)

## 2013-05-20 NOTE — Transfer of Care (Signed)
Immediate Anesthesia Transfer of Care Note  Patient: [name]  Procedure(s) Performed: Procedure(s): RIGHT TOTAL KNEE POLY EXCHANGE  (Right) STEROID INJECTION (Left)  Patient Location: PACU  Anesthesia Type:General and Regional  Level of Consciousness: awake and alert   Airway & Oxygen Therapy: Patient Spontanous Breathing and Patient connected to nasal cannula oxygen  Post-op Assessment: Report given to PACU RN, Post -op Vital signs reviewed and stable and Patient moving all extremities X 4  Post vital signs: Reviewed and stable  Complications: No apparent anesthesia complications

## 2013-05-20 NOTE — Anesthesia Procedure Notes (Addendum)
Procedure Name: Intubation Date/Time: 05/20/2013 7:41 AM Performed by: Elberta LeatherwoodURNER, SARAH E Pre-anesthesia Checklist: Patient identified, Emergency Drugs available, Suction available and Patient being monitored Patient Re-evaluated:Patient Re-evaluated prior to inductionOxygen Delivery Method: Circle system utilized Preoxygenation: Pre-oxygenation with 100% oxygen Intubation Type: IV induction Ventilation: Mask ventilation without difficulty Laryngoscope Size: Miller and 2 Grade View: Grade II Tube type: Oral Tube size: 7.5 mm Number of attempts: 1 Airway Equipment and Method: Stylet Placement Confirmation: ETT inserted through vocal cords under direct vision,  positive ETCO2 and breath sounds checked- equal and bilateral Secured at: 22 cm Tube secured with: Tape Dental Injury: Teeth and Oropharynx as per pre-operative assessment    Anesthesia Regional Block:  Adductor canal block  Pre-Anesthetic Checklist: ,, timeout performed, Correct Patient, Correct Site, Correct Laterality, Correct Procedure, Correct Position, site marked, Risks and benefits discussed,  Surgical consent,  Pre-op evaluation,  At surgeon's request and post-op pain management  Laterality: Right  Prep: chloraprep       Needles:   Needle Type: Echogenic Stimulator Needle     Needle Length: 9cm 9 cm Needle Gauge: 22 and 22 G    Additional Needles:  Procedures: ultrasound guided (picture in chart) Adductor canal block Narrative:  Start time: 05/20/2013 7:04 AM End time: 05/20/2013 7:16 AM Injection made incrementally with aspirations every 5 mL. Anesthesiologist: Dr Gypsy BalsamKasik  Additional Notes: 1610-9604: [id number] R Adductor Canal Block POP CHG prep, sterile tech #22 echo needle with good US visualization_PIX in chart Multiple neg asp Vernia BuffMarc .5% withe epi 1:200000 total 25cc No compl Dr Gypsy BalsamKasik

## 2013-05-20 NOTE — Care Management Note (Signed)
CARE MANAGEMENT NOTE 05/20/2013  Patient:  [name],[name]   Account Number:  [id number]  Date Initiated:  05/20/2013  Documentation initiated by:  Vance PeperBRADY,Lisamarie Coke  Subjective/Objective Assessment:   58 yr old female s/p right total knee revision.     Action/Plan:   PT/OT eval   Anticipated DC Date:  05/21/2013   Anticipated DC Plan:  HOME W HOME HEALTH SERVICES         Choice offered to / List presented to:             Status of service:  In process, will continue to follow

## 2013-05-20 NOTE — Anesthesia Postprocedure Evaluation (Signed)
  Anesthesia Post-op Note  Patient: [name]  Procedure(s) Performed: Procedure(s): RIGHT TOTAL KNEE POLY EXCHANGE  (Right) STEROID INJECTION (Left)  Patient Location: PACU  Anesthesia Type:General  Level of Consciousness: awake  Airway and Oxygen Therapy: Patient Spontanous Breathing  Post-op Pain: mild  Post-op Assessment: Post-op Vital signs reviewed  Post-op Vital Signs: Reviewed  Last Vitals:  Filed Vitals:   05/20/13 0957  BP:   Pulse: 77  Temp:   Resp: 18    Complications: No apparent anesthesia complications

## 2013-05-20 NOTE — H&P (Signed)
[name] MRN:  [id number] DOB/SEX:  09-25-1955/female  CHIEF COMPLAINT:  Painful right Knee  HISTORY: Patient is a 58 y.o. female presented with a history of pain in the right knee. Onset of symptoms was abrupt starting several months ago with gradually worsening course since that time. Prior procedures on the knee include arthroplasty. Patient has been treated conservatively with over-the-counter NSAIDs and activity modification. Patient currently rates pain in the knee at 9 out of 10 with activity. There is pain at night.  PAST MEDICAL HISTORY: There are no active problems to display for this patient.  Past Medical History  Diagnosis Date  . GERD (gastroesophageal reflux disease)     takes Protonix daily  . Muscle spasm     takes Zanaflex daily  . Nasal congestion     uses Dymista daily as needed  . Hyperlipidemia     takes Crestor daily  . Complication of anesthesia     slow to wake up after one of her surgeries  . Hypertension     hx of and has been off meds for yrs--lost weight  . History of migraine     takes Topamax nightly;[place]  . Fibromyalgia   . Arthritis   . Joint pain   . Chronic back pain   . History of colon polyps   . Diverticulosis   . Constipation     takes OTC stool softener  . Yeast infection     was on antibiotic and going to pick up script for this  . Anemia   . Depression     takes Celexa daily   Past Surgical History  Procedure Laterality Date  . Joint replacement      right knee replacement  . Knee arthroscopy Right   . Tonsillectomy    . Appendectomy    . Cataract surgery Bilateral   . Carpal tunnel release    . Breast surgery      d/t clogged duct right side  . Abdominal exploration surgery    . Excision morton's neuroma    . Back surgery    . Colonoscopy    . Esophagogastroduodenoscopy       MEDICATIONS:   Prescriptions prior to admission  Medication Sig Dispense Refill  . amoxicillin-clavulanate (AUGMENTIN) 875-125  MG per tablet Take 1 tablet by mouth 2 (two) times daily. For 14 days      . Azelastine-Fluticasone (DYMISTA) 137-50 MCG/ACT SUSP Place 1 spray into the nose daily as needed (congestion).       Marland Kitchen. BIOTIN 5000 PO Take 5,000 mg by mouth daily.      . citalopram (CELEXA) 40 MG tablet Take 40 mg by mouth daily.      . Multiple Vitamins-Minerals (MULTIVITAMIN WITH MINERALS) tablet Take 1 tablet by mouth daily.      . pantoprazole (PROTONIX) 40 MG tablet Take 40 mg by mouth daily.      . rosuvastatin (CRESTOR) 10 MG tablet Take 10 mg by mouth daily.      Marland Kitchen. tiZANidine (ZANAFLEX) 4 MG tablet Take 4 mg by mouth 2 (two) times daily.      Marland Kitchen. topiramate (TOPAMAX) 25 MG tablet Take 75 mg by mouth at bedtime.        ALLERGIES:   Allergies  Allergen Reactions  . Flexeril [Cyclobenzaprine] Hives  . Metronidazole Hives    REVIEW OF SYSTEMS:  A comprehensive review of systems was negative.   FAMILY HISTORY:  History reviewed. No pertinent family history.  SOCIAL HISTORY:   History  Substance Use Topics  . Smoking status: Current Every Day Smoker -- 0.50 packs/day for 35 years  . Smokeless tobacco: Not on file  . Alcohol Use: Yes     Comment: occasionally     EXAMINATION:  Vital signs in last 24 hours: Temp:  [98.2 F (36.8 C)] 98.2 F (36.8 C) (04/27 0616) Pulse Rate:  [56] 56 (04/27 0616) Resp:  [18] 18 (04/27 0616) BP: (124)/(60) 124/60 mmHg (04/27 0616) SpO2:  [98 %] 98 % (04/27 0616) Weight:  [100.4 kg (221 lb 5.5 oz)] 100.4 kg (221 lb 5.5 oz) (04/27 0616)  General appearance: alert, cooperative and no distress Lungs: clear to auscultation bilaterally Heart: regular rate and rhythm, S1, S2 normal, no murmur, click, rub or gallop Abdomen: soft, non-tender; bowel sounds normal; no masses,  no organomegaly Extremities: extremities normal, atraumatic, no cyanosis or edema and Homans sign is negative, no sign of DVT Pulses: 2+ and symmetric Skin: Skin color, texture, turgor normal. No  rashes or lesions Neurologic: Alert and oriented X 3, normal strength and tone. Normal symmetric reflexes. Normal coordination and gait  Musculoskeletal:  ROM 0-100, Ligaments intact,  Imaging Review Plain radiographs demonstrate components in good alignment.The overall alignment is neutral. The bone quality appears to be good for age and reported activity level.  Assessment/Plan:  right knee pain  . The patient has failed conservative treatment.  The clearance notes were reviewed.  After discussion with the patient it was felt that Total Knee Revision vs poly exchange was indicated. The procedure,  risks, and benefits of total knee arthroplasty were presented and reviewed. The risks including but not limited to aseptic loosening, infection, blood clots, vascular injury, stiffness, patella tracking problems complications among others were discussed. The patient acknowledged the explanation, agreed to proceed with the plan.  Altamese CabalMaurice [name] 05/20/2013, 6:53 AM

## 2013-05-20 NOTE — Progress Notes (Signed)
Orthopedic Tech Progress Note Patient Details:  Clearance CootsJudy M Seevers 09-03-1955 [id number] Overhead frame with trapeze CPM Right Knee CPM Right Knee: On Right Knee Flexion (Degrees): 90 Right Knee Extension (Degrees): 0      Early CharsWilliam Anthony Vy Badley 05/20/2013, 10:12 AM

## 2013-05-20 NOTE — Progress Notes (Signed)
Utilization Review Completed.Devin Ganaway T Dowell4/27/2015  

## 2013-05-20 NOTE — Anesthesia Preprocedure Evaluation (Addendum)
Anesthesia Evaluation  Patient identified by MRN, date of birth, ID band Patient awake    Reviewed: Allergy & Precautions, H&P , NPO status , Patient's Chart, lab work & pertinent test results  Airway Mallampati: II TM Distance: >3 FB Neck ROM: Full    Dental  (+) Dental Advisory Given, Teeth Intact   Pulmonary COPDCurrent Smoker,  + rhonchi         Cardiovascular hypertension, Rhythm:Regular Rate:Normal     Neuro/Psych  Headaches, PSYCHIATRIC DISORDERS Depression    GI/Hepatic GERD-  Medicated,  Endo/Other    Renal/GU      Musculoskeletal  (+) Arthritis -, Fibromyalgia -  Abdominal (+) + obese,   Peds  Hematology   Anesthesia Other Findings   Reproductive/Obstetrics                        Anesthesia Physical Anesthesia Plan  ASA: III  Anesthesia Plan: General   Post-op Pain Management:    Induction: Intravenous  Airway Management Planned: Oral ETT  Additional Equipment:   Intra-op Plan:   Post-operative Plan: Extubation in OR  Informed Consent: I have reviewed the patients History and Physical, chart, labs and discussed the procedure including the risks, benefits and alternatives for the proposed anesthesia with the patient or authorized representative who has indicated his/her understanding and acceptance.     Plan Discussed with: CRNA and Surgeon  Anesthesia Plan Comments:        Anesthesia Quick Evaluation

## 2013-05-20 NOTE — Op Note (Signed)
Dictation Number:  [id number]

## 2013-05-21 ENCOUNTER — Encounter (HOSPITAL_COMMUNITY): Payer: Self-pay | Admitting: Orthopedic Surgery

## 2013-05-21 LAB — BASIC METABOLIC PANEL
BUN: 11 mg/dL (ref 6–23)
CHLORIDE: 106 meq/L (ref 96–112)
CO2: 18 mEq/L — ABNORMAL LOW (ref 19–32)
Calcium: 8.8 mg/dL (ref 8.4–10.5)
Creatinine, Ser: 0.63 mg/dL (ref 0.50–1.10)
GFR calc non Af Amer: >90 mL/min (ref >90)
Glucose, Bld: 129 mg/dL — ABNORMAL HIGH (ref 70–99)
Potassium: 4.5 mEq/L (ref 3.7–5.3)
Sodium: 139 mEq/L (ref 137–147)

## 2013-05-21 LAB — CBC
HEMATOCRIT: 35.7 % — AB (ref 36.0–46.0)
Hemoglobin: 11.7 g/dL — ABNORMAL LOW (ref 12.0–15.0)
MCH: 31 pg (ref 26.0–34.0)
MCHC: 32.8 g/dL (ref 30.0–36.0)
MCV: 94.4 fL (ref 78.0–100.0)
Platelets: 201 10*3/uL (ref 150–400)
RBC: 3.78 MIL/uL — ABNORMAL LOW (ref 3.87–5.11)
RDW: 13.7 % (ref 11.5–15.5)
WBC: 13.7 10*3/uL — AB (ref 4.0–10.5)

## 2013-05-21 MED ORDER — OXYCODONE HCL 5 MG PO TABS: 5.0000 mg | ORAL_TABLET | ORAL | Status: DC | PRN

## 2013-05-21 MED ORDER — ENOXAPARIN SODIUM 40 MG/0.4ML ~~LOC~~ SOLN: 40.0000 mg | SUBCUTANEOUS | Status: DC

## 2013-05-21 MED ORDER — OXYCODONE HCL ER 10 MG PO T12A: 10.0000 mg | EXTENDED_RELEASE_TABLET | Freq: Two times a day (BID) | ORAL | Status: DC

## 2013-05-21 NOTE — Progress Notes (Signed)
Physical Therapy Treatment Patient Details Name: Clearance CootsJudy M Abundis MRN: [id number] DOB: August 21, 1955 Today's Date: 05/21/2013    History of Present Illness Pt admitted for R total knee poly exchange    PT Comments    Pt moving great this pm and ready for D/C from PT standpoint.    Follow Up Recommendations  Home health PT;Supervision for mobility/OOB     Equipment Recommendations  None recommended by PT    Recommendations for Other Services       Precautions / Restrictions Precautions Precautions: Knee;Fall Precaution Booklet Issued: Yes (comment) Restrictions Weight Bearing Restrictions: Yes RLE Weight Bearing: Weight bearing as tolerated    Mobility  Bed Mobility Overal bed mobility: Modified Independent                Transfers Overall transfer level: Modified independent Equipment used: Rolling walker (2 wheeled) Transfers: Sit to/from Stand              Ambulation/Gait Ambulation/Gait assistance: Supervision Ambulation Distance (Feet): 400 Feet Assistive device: Rolling walker (2 wheeled) Gait Pattern/deviations: Step-through pattern;Decreased stride length         Stairs Stairs: Yes Stairs assistance: Min assist Stair Management: No rails;Backwards;With walker Number of Stairs: 2    Wheelchair Mobility    Modified Rankin (Stroke Patients Only)       Balance                                    Cognition Arousal/Alertness: Awake/alert Behavior During Therapy: WFL for tasks assessed/performed Overall Cognitive Status: Within Functional Limits for tasks assessed                      Exercises      General Comments        Pertinent Vitals/Pain "starting to get tight" RN made aware need for pain meds.      Home Living                      Prior Function            PT Goals (current goals can now be found in the care plan section) Acute Rehab PT Goals Patient Stated Goal: active again  without pain Time For Goal Achievement: 05/27/13 Potential to Achieve Goals: Good Progress towards PT goals: Progressing toward goals    Frequency  7X/week    PT Plan Current plan remains appropriate    Co-evaluation             End of Session Equipment Utilized During Treatment: Gait belt Activity Tolerance: Patient tolerated treatment well Patient left: in bed;with call bell/phone within reach;with family/visitor present     Time: 1344-1410 PT Time Calculation (min): 26 min  Charges:  $Gait Training: 23-37 mins                    G CodesSunny Schlein:      Corri Delapaz F Amandamarie Feggins, [place] CarolinaPT [phone] 05/21/2013, 2:19 PM

## 2013-05-21 NOTE — Discharge Instructions (Signed)
Diet: As you were doing prior to hospitalization  ° °Activity:  Increase activity slowly as tolerated  °                No lifting or driving for 6 weeks ° °Shower:  May shower without a dressing once there is no drainage from your wound. °                Do NOT wash over the wound. °                °Dressing:  You may change your dressing on Wednesday °                   Then change the dressing daily with sterile 4"x4"s gauze dressing  °                   And TED hose for knees. ° °Weight Bearing:  Weight bearing as tolerated as taught in physical therapy.  Use a                                walker or Crutches as instructed. ° °To prevent constipation: you may use a stool softener such as - °              Colace ( over the counter) 100 mg by mouth twice a day  °              Drink plenty of fluids ( prune juice may be helpful) and high fiber foods °               Miralax ( over the counter) for constipation as needed.   ° °Precautions:  If you experience chest pain or shortness of breath - call 911 immediately               For transfer to the hospital emergency department!! °              If you develop a fever greater that 101 F, purulent drainage from wound,                             increased redness or drainage from wound, or calf pain -- Call the office. ° °Follow- Up Appointment:  Please call for an appointment to be seen on 06/04/13 °                                             [place] office:  [phone] °           [address] , [place] [address] °               ° ° °

## 2013-05-21 NOTE — Care Management Note (Signed)
CARE MANAGEMENT NOTE 05/21/2013  Patient:  [name],[name]   Account Number:  [id number]  Date Initiated:  05/20/2013  Documentation initiated by:  Vance PeperBRADY,Cayley Pester  Subjective/Objective Assessment:   58 yr old female s/p right total knee revision.     Action/Plan:   PT/OT eval   Anticipated DC Date:  05/21/2013   Anticipated DC Plan:  HOME W HOME HEALTH SERVICES      DC Planning Services  CM consult      [facility]   Choice offered to / List presented to:  C-1 Patient   DME arranged  CPM      DME agency  [employer]     HH arranged  HH[facility]      [facility]   Status of service:  Completed, signed off Medicare Important Message given?   (If response is "NO", the following Medicare IM given date fields will be blank) Date Medicare IM given:   Date Additional Medicare IM given:    Discharge Disposition:  HOME W HOME HEALTH SERVICES  Per UR Regulation:    If discussed at Long Length of Stay Meetings, dates discussed:    Comments:  05/21/13 10:38am Vance PeperSusan Antoine Vandermeulen, RN BSN Case Manager Case manager spoke with patient concerning home health and DME needs. [facility], no changes. Has rolling walker and 3in1. CPM has been delivered.Has family support at discharge.

## 2013-05-21 NOTE — Progress Notes (Signed)
Physical Therapy Treatment Patient Details Name: Clearance CootsJudy M Ruotolo MRN: [id number] DOB: 05/18/1955 Today's Date: 05/21/2013    History of Present Illness Pt admitted for R total knee poly exchange    PT Comments    Pt doing great this am.  Moving well and anticipate ready for D/C this pm.    Follow Up Recommendations  Home health PT;Supervision for mobility/OOB     Equipment Recommendations  None recommended by PT    Recommendations for Other Services       Precautions / Restrictions Precautions Precautions: Knee;Fall Precaution Booklet Issued: Yes (comment) Restrictions Weight Bearing Restrictions: Yes RLE Weight Bearing: Weight bearing as tolerated    Mobility  Bed Mobility Overal bed mobility: Modified Independent                Transfers Overall transfer level: Needs assistance Equipment used: Rolling walker (2 wheeled) Transfers: Sit to/from Stand Sit to Stand: Supervision         General transfer comment: cues for UE use only.    Ambulation/Gait Ambulation/Gait assistance: Supervision Ambulation Distance (Feet): 300 Feet Assistive device: Rolling walker (2 wheeled) Gait Pattern/deviations: Step-to pattern;Decreased step length - left;Decreased stance time - right     General Gait Details: cues for equalizing step lengths, upright posture.     Stairs            Wheelchair Mobility    Modified Rankin (Stroke Patients Only)       Balance                                    Cognition Arousal/Alertness: Awake/alert Behavior During Therapy: WFL for tasks assessed/performed Overall Cognitive Status: Within Functional Limits for tasks assessed                      Exercises Total Joint Exercises Ankle Circles/Pumps: AROM;Both;10 reps Quad Sets: AROM;Both;10 reps Long Arc Quad: AROM;Right;10 reps Knee Flexion: AROM;Right;10 reps Goniometric ROM: ~5 - 80    General Comments        Pertinent Vitals/Pain  Pt indicates a little dull still from block.      Home Living                      Prior Function            PT Goals (current goals can now be found in the care plan section) Acute Rehab PT Goals Patient Stated Goal: active again without pain Time For Goal Achievement: 05/27/13 Potential to Achieve Goals: Good Progress towards PT goals: Progressing toward goals    Frequency  7X/week    PT Plan Current plan remains appropriate    Co-evaluation             End of Session Equipment Utilized During Treatment: Gait belt Activity Tolerance: Patient tolerated treatment well Patient left: in chair;with call bell/phone within reach     Time: 0745-0832 PT Time Calculation (min): 47 min  Charges:  $Gait Training: 8-22 mins $Therapeutic Exercise: 8-22 mins                    G CodesSunny Schlein:      Mahir Prabhakar F Yitzel Shasteen, [place] CarolinaPT [phone] 05/21/2013, 9:55 AM

## 2013-05-21 NOTE — Plan of Care (Signed)
Problem: Consults Goal: Diagnosis- Total Joint Replacement Revision Total Knee Right

## 2013-05-21 NOTE — Progress Notes (Signed)
SPORTS MEDICINE AND JOINT REPLACEMENT  Taylor SpurlingStephen Lucey, MD   Taylor CabalMaurice Tyshawn Keel, PA-C [address], [place], [place]  [address]                             [phone]   PROGRESS NOTE  Subjective:  negative for Chest Pain  negative for Shortness of Breath  negative for Nausea/Vomiting   negative for Calf Pain  negative for Bowel Movement   Tolerating Diet: yes         Patient reports pain as 3 on 0-10 scale.    Objective: Vital signs in last 24 hours:   Patient Vitals for the past 24 hrs:  BP Temp Pulse Resp SpO2 Height Weight  05/21/13 0800 - - - 18 - - -  05/21/13 0640 120/80 mmHg 98.2 F (36.8 C) 75 18 99 % - -  05/21/13 0400 - - - 18 98 % - -  05/21/13 0000 - - - 18 97 % - -  05/20/13 2202 113/76 mmHg 98.2 F (36.8 C) 70 18 99 % - -  05/20/13 2000 - - - 18 99 % - -  05/20/13 1751 - - - - - 5\' 8"  (1.727 m) 100.4 kg (221 lb 5.5 oz)  05/20/13 1453 112/60 mmHg 98 F (36.7 C) 70 16 99 % - -    @flow {1959:LAST@   Intake/Output from previous day:   04/27 0701 - 04/28 0700 In: 2535 [P.O.:120; I.V.:2315] Out: 320 [Drains:70]   Intake/Output this shift:   04/28 0701 - 04/28 1900 In: -  Out: 400 [Urine:400]   Intake/Output     04/27 0701 - 04/28 0700 04/28 0701 - 04/29 0700   P.O. 120    I.V. (mL/kg) 2315 (23.1)    IV Piggyback 100    Total Intake(mL/kg) 2535 (25.2)    Urine (mL/kg/hr)  400 (0.8)   Drains 70 (0)    Blood 250 (0.1)    Total Output 320 400   Net +2215 -400        Urine Occurrence 3 x 1 x      LABORATORY DATA:  Recent Labs  05/20/13 1355 05/21/13 0606  WBC 15.6* 13.7*  HGB 12.4 11.7*  HCT 37.3 35.7*  PLT 222 201    Recent Labs  05/20/13 1355 05/21/13 0606  NA  --  139  K  --  4.5  CL  --  106  CO2  --  18*  BUN  --  11  CREATININE 0.69 0.63  GLUCOSE  --  129*  CALCIUM  --  8.8   Lab Results  Component Value Date   INR 1.01 05/10/2013   INR 1.01 08/31/2010   INR 2.45* 02/08/2010    Examination:  General appearance:  alert, cooperative and no distress Extremities: Homans sign is negative, no sign of DVT  Wound Exam: clean, dry, intact   Drainage:  None: wound tissue dry  Motor Exam: EHL and FHL Intact  Sensory Exam: Deep Peroneal normal   Assessment:    1 Day Post-Op  Procedure(s) (LRB): RIGHT TOTAL KNEE POLY EXCHANGE  (Right) STEROID INJECTION (Left)  ADDITIONAL DIAGNOSIS:  Active Problems:   Pain, knee  Acute Blood Loss Anemia   Plan: Physical Therapy as ordered Weight Bearing as Tolerated (WBAT)  DVT Prophylaxis:  Lovenox  DISCHARGE PLAN: Home  DISCHARGE NEEDS: HHPT, CPM, Walker and 3-in-1 comode seat  Taylor CabalMaurice Kayana Peters 05/21/2013, 12:09 PM

## 2013-05-21 NOTE — Progress Notes (Signed)
OT Cancellation Note  Patient Details Name: [name] MRN: [id number] DOB: [birth year]   Cancelled Treatment:    Reason Eval/Treat Not Completed: OT screened, no needs identified, will sign off;pt stated she [facility]  Pilar GrammesKathryn H Shad Ledvina 05/21/2013, 8:59 AM

## 2013-05-21 NOTE — Op Note (Signed)
NAMGar Ponto:  [name], [name]              ACCOUNT NO.:  [id number]  MEDICAL RECORD NO.:  [id number]  LOCATION:  [id number]                        FACILITY:  [facility]  PHYSICIAN:  Mila HomerStephen D. Sherlean FootLucey, M.D. DATE OF BIRTH:  22-Jul-1955  DATE OF PROCEDURE:  05/20/2013 DATE OF DISCHARGE:                              OPERATIVE REPORT   SURGEON:  Mila HomerStephen D. Sherlean FootLucey, M.D.  ASSISTANT:  Altamese CabalMaurice Jones, PA-C  ANESTHESIA:  General.  PREOPERATIVE DIAGNOSIS:  Failed right total knee arthroplasty.  POSTOPERATIVE DIAGNOSIS:  Failed right total knee arthroplasty.  PROCEDURE:  Right total knee revision (1 side only with polyethylene exchange) and open synovectomy.  INDICATION FOR PROCEDURE:  The patient is a 58 year old who is couple of years status post a primary total joint done by another Careers advisersurgeon.  She has had pain.  She had some early infection, which was treated with a washout and antibiotic absorbable beads.  She continued to have pain, came to see me over a year after that.  Bone scan and workup for infection were negative.  She continued to have pain and informed consent was obtained for potential complete revision versus antibiotic cement spacer with a second stage revision or just an open synovectomy and polyethylene exchange.  DESCRIPTION OF PROCEDURE:  The patient was laid in the supine position, administered general anesthesia.  Right leg prepped and draped in usual fashion.  The extremity was exsanguinated with the Esmarch and the tourniquet inflated to 350 mmHg.  A midline incision was made over the old incision with a #10 blade.  New blade was used to make a median parapatellar arthrotomy and perform a synovectomy.  There were lots and lots of synovitis.  There was a brownish color to it, almost a pigment from hemarthrosis, which I do think was probably painful and causing her stiffness.  I then removed the keel from the rotating platform polyethylene and removed the polyethylene.  Then, I got to  the back of the knee where I removed more synovitis.  I did send off the very first piece of deep tissue for frozen section and it came back negative for any infection whatsoever.  I then lavaged copiously and after meticulous synovectomy, I placed another matching polyethylene 10 mm for a size 5 tray back into the knee, located it, let the tourniquet down, obtained hemostasis, and copiously irrigated.  I then closed the arthrotomy with figure-of-eight #1 Vicryl sutures, buried 0-Vicryl sutures in the deep soft tissue, subcuticular 2-0 Vicryl stitches, and skin staples.  I then put in 30 mL of Exparel, 0.5% Marcaine with epinephrine in the deep and superficial soft tissues prior to closure.  Left the drain in the knee coming out superolateral prior to closure.  EBL 300 mL.  Tourniquet time was 29 minutes.          ______________________________ Mila HomerStephen D. Sherlean FootLucey, M.D.     SDL/MEDQ  D:  05/20/2013  T:  05/21/2013  Job:  [id number]

## 2013-05-22 NOTE — Discharge Summary (Signed)
SPORTS MEDICINE & JOINT REPLACEMENT   Georgena Spurling, MD   Altamese Cabal, PA-C [address] [place], [place], [place]  [address]                             [phone]  PATIENT ID: [name]        MRN:  [id number]          DOB/AGE: 57-30-1957 / 58 y.o.    DISCHARGE SUMMARY  ADMISSION DATE:    05/20/2013 DISCHARGE DATE:  05/21/2013  ADMISSION DIAGNOSIS: loosening/failed right total knee    DISCHARGE DIAGNOSIS:  loosening/failed right total knee    ADDITIONAL DIAGNOSIS: Active Problems:   Pain, knee  Past Medical History  Diagnosis Date  . GERD (gastroesophageal reflux disease)     takes Protonix daily  . Muscle spasm     takes Zanaflex daily  . Nasal congestion     uses Dymista daily as needed  . Hyperlipidemia     takes Crestor daily  . Complication of anesthesia     slow to wake up after one of her surgeries  . Hypertension     hx of and has been off meds for yrs--lost weight  . History of migraine     takes Topamax nightly;aurora  . Fibromyalgia   . Arthritis   . Joint pain   . Chronic back pain   . History of colon polyps   . Diverticulosis   . Constipation     takes OTC stool softener  . Yeast infection     was on antibiotic and going to pick up script for this  . Anemia   . Depression     takes Celexa daily    PROCEDURE: Procedure(s): RIGHT TOTAL KNEE POLY EXCHANGE  STEROID INJECTION on 05/20/2013  CONSULTS:     HISTORY:  See H&P in chart  HOSPITAL COURSE:  [name] is a 58 y.o. admitted on 05/20/2013 and found to have a diagnosis of loosening/failed right total knee.  After appropriate laboratory studies were obtained  they were taken to the operating room on 05/20/2013 and underwent Procedure(s): RIGHT TOTAL KNEE POLY EXCHANGE  STEROID INJECTION.   They were given perioperative antibiotics:  Anti-infectives   Start     Dose/Rate Route Frequency Ordered Stop   05/20/13 1400  ceFAZolin (ANCEF) IVPB 2 g/50 mL premix     2 g 100  mL/hr over 30 Minutes Intravenous Every 6 hours 05/20/13 1104 05/21/13 0025   05/20/13 0600  ceFAZolin (ANCEF) IVPB 2 g/50 mL premix     2 g 100 mL/hr over 30 Minutes Intravenous On call to O.R. 05/19/13 1247 05/20/13 0745    .  Tolerated the procedure well.  Placed with a foley intraoperatively.  Given Ofirmev at induction and for 48 hours.    POD# 1: Vital signs were stable.  Patient denied Chest pain, shortness of breath, or calf pain.  Patient was started on Lovenox 30 mg subcutaneously twice daily at 8am.  Consults to PT, OT, and care management were made.  The patient was weight bearing as tolerated.  CPM was placed on the operative leg 0-90 degrees for 6-8 hours a day.  Incentive spirometry was taught.  Dressing was changed.  Marcaine pump and hemovac were discontinued.      POD #2, Continued  PT for ambulation and exercise program.  IV saline locked.  O2 discontinued.    The remainder  of the hospital course was dedicated to ambulation and strengthening.   The patient was discharged on 1 day post op in  Good condition.  Blood products given:none  DIAGNOSTIC STUDIES: Recent vital signs: No data found.      Recent laboratory studies:  Recent Labs  05/20/13 1355 05/21/13 0606  WBC 15.6* 13.7*  HGB 12.4 11.7*  HCT 37.3 35.7*  PLT 222 201    Recent Labs  05/20/13 1355 05/21/13 0606  NA  --  139  K  --  4.5  CL  --  106  CO2  --  18*  BUN  --  11  CREATININE 0.69 0.63  GLUCOSE  --  129*  CALCIUM  --  8.8   Lab Results  Component Value Date   INR 1.01 05/10/2013   INR 1.01 08/31/2010   INR 2.45* 02/08/2010     Recent Radiographic Studies :  Dg Chest 2 View  05/10/2013   CLINICAL DATA:  Preoperative evaluation for right knee revision, hypertension, smoking, hyperlipidemia  EXAM: CHEST  2 VIEW  COMPARISON:  02/01/2010  FINDINGS: Normal heart size, mediastinal contours, and pulmonary vascularity.  Minimal peribronchial thickening.  No pulmonary infiltrate, pleural  effusion, or pneumothorax.  Bones demineralized.  IMPRESSION: Minimal chronic bronchitic changes without infiltrate.   Electronically Signed   By: Ulyses SouthwardMark  Boles M.D.   On: 05/10/2013 15:53    DISCHARGE INSTRUCTIONS: Discharge Orders   Future Orders Complete By Expires   Call MD / Call 911  As directed    Change dressing  As directed    Constipation Prevention  As directed    CPM  As directed    Diet - low sodium heart healthy  As directed    Do not put a pillow under the knee. Place it under the heel.  As directed    Driving restrictions  As directed    Increase activity slowly as tolerated  As directed    Lifting restrictions  As directed    TED hose  As directed       DISCHARGE MEDICATIONS:     Medication List         amoxicillin-clavulanate 875-125 MG per tablet  Commonly known as:  AUGMENTIN  Take 1 tablet by mouth 2 (two) times daily. For 14 days     BIOTIN 5000 PO  Take 5,000 mg by mouth daily.     citalopram 40 MG tablet  Commonly known as:  CELEXA  Take 40 mg by mouth daily.     DYMISTA 137-50 MCG/ACT Susp  Generic drug:  Azelastine-Fluticasone  Place 1 spray into the nose daily as needed (congestion).     enoxaparin 40 MG/0.4ML injection  Commonly known as:  LOVENOX  Inject 0.4 mLs (40 mg total) into the skin daily.     multivitamin with minerals tablet  Take 1 tablet by mouth daily.     oxyCODONE 5 MG immediate release tablet  Commonly known as:  Oxy IR/ROXICODONE  Take 1-2 tablets (5-10 mg total) by mouth every 4 (four) hours as needed for breakthrough pain.     OxyCODONE 10 mg T12a 12 hr tablet  Commonly known as:  OXYCONTIN  Take 1 tablet (10 mg total) by mouth every 12 (twelve) hours.     pantoprazole 40 MG tablet  Commonly known as:  PROTONIX  Take 40 mg by mouth daily.     rosuvastatin 10 MG tablet  Commonly known as:  CRESTOR  Take 10 mg  by mouth daily.     tiZANidine 4 MG tablet  Commonly known as:  ZANAFLEX  Take 4 mg by mouth 2 (two)  times daily.     topiramate 25 MG tablet  Commonly known as:  TOPAMAX  Take 75 mg by mouth at bedtime.        FOLLOW UP VISIT:       Follow-up Information   [facility]. ([facility] will contact you concerning start date and time for physical therapy.)    Contact information:   [address] [place] [address] [phone]       Follow up with Raymon MuttonLUCEY,STEPHEN D, MD. Call on 06/04/2013.   Specialty:  Orthopedic Surgery   Contact information:   [address] [place] [address] [phone]       DISPOSITION: HOME  CONDITION:  Noralyn PickGood   Johnthomas Lader 05/22/2013, 7:43 PM

## 2013-05-23 LAB — BODY FLUID CULTURE: Culture: NO GROWTH

## 2013-05-25 LAB — ANAEROBIC CULTURE: GRAM STAIN: NONE SEEN

## 2014-10-24 ENCOUNTER — Other Ambulatory Visit: Payer: Self-pay | Admitting: Orthopedic Surgery

## 2014-10-24 DIAGNOSIS — M25562 Pain in left knee: Secondary | ICD-10-CM

## 2014-10-30 ENCOUNTER — Other Ambulatory Visit: Payer: Self-pay

## 2017-09-04 DIAGNOSIS — Z01818 Encounter for other preprocedural examination: Secondary | ICD-10-CM

## 2017-09-11 ENCOUNTER — Other Ambulatory Visit: Payer: Self-pay | Admitting: Orthopedic Surgery

## 2017-10-04 ENCOUNTER — Other Ambulatory Visit (HOSPITAL_COMMUNITY): Payer: Self-pay | Admitting: *Deleted

## 2017-10-04 NOTE — Patient Instructions (Addendum)
[name]  10/04/2017   Your procedure is scheduled on: 10-09-17  Report to [facility] Main  Entrance  Report to admitting at 945 AM    Call this number if you have problems the morning of surgery [phone]   Remember: Do not eat food or drink liquids :After Midnight.  How to Manage Your Diabetes Before and After Surgery  Why is it important to control my blood sugar before and after surgery? . Improving blood sugar levels before and after surgery helps healing and can limit problems. . A way of improving blood sugar control is eating a healthy diet by: o  Eating less sugar and carbohydrates o  Increasing activity/exercise o  Talking with your doctor about reaching your blood sugar goals . High blood sugars (greater than 180 mg/dL) can raise your risk of infections and slow your recovery, so you will need to focus on controlling your diabetes during the weeks before surgery. . Make sure that the doctor who takes care of your diabetes knows about your planned surgery including the date and location.  o  o  . Marland Kitchen    WHAT DO I DO ABOUT MY DIABETES MEDICATION?  Marland Kitchen Do not take oral diabetes medicines (pills) the morning of surgery.  . THE DAY BEFORE SURGERY TAKE YOUR METFORMIN AS USUAL..      . THE MORNING OF SURGERY DO NOT TAKE YOUR METFORMIN .       Patient Signature:  Date:   Nurse Signature:  Date:   Reviewed and Endorsed by [facility] Patient Education Committee, August 2015   Take these medicines the morning of surgery with A SIP OF WATER: BUSPIRONE(BUSPAR), GABAPENTIN (NEURONTIN), TOPIRAMATE (TOPAMAX), TIZANIDINE (ZANAFLEX), LEVOTHYROXINE (SYNTHROID), PANTAPRAZOLE (PROTONIX), TRINTELLIX DO NOT TAKE ANY DIABETIC MEDICATIONS DAY OF YOUR SURGERY                               You may not have any metal on your body including hair pins and              piercings  Do not wear jewelry, make-up, lotions, powders or perfumes, deodorant              Do not wear nail polish.  Do not shave  48 hours prior to surgery.              Men may shave face and neck.   Do not bring valuables to the hospital. [facility] IS NOT             RESPONSIBLE   FOR VALUABLES.  Contacts, dentures or bridgework may not be worn into surgery.  Leave suitcase in the car. After surgery it may be brought to your room.     ____________             [facility] - Preparing for Surgery Before surgery, you can play an important role.  Because skin is not sterile, your skin needs to be as free of germs as possible.  You can reduce the number of germs on your skin by washing with CHG (chlorahexidine gluconate) soap before surgery.  CHG is an antiseptic cleaner which kills germs and bonds with the skin to continue killing germs even after washing. Please DO NOT use if you have an allergy to CHG or antibacterial soaps.  If  your skin becomes reddened/irritated stop using the CHG and inform your nurse when you arrive at Short Stay. Do not shave (including legs and underarms) for at least 48 hours prior to the first CHG shower.  You may shave your face/neck. Please follow these instructions carefully:  1.  Shower with CHG Soap the night before surgery and the  morning of Surgery.  2.  If you choose to wash your hair, wash your hair first as usual with your  normal  shampoo.  3.  After you shampoo, rinse your hair and body thoroughly to remove the  shampoo.                           4.  Use CHG as you would any other liquid soap.  You can apply chg directly  to the skin and wash                       Gently with a scrungie or clean washcloth.  5.  Apply the CHG Soap to your body ONLY FROM THE NECK DOWN.   Do not use on face/ open                           Wound or open sores. Avoid contact with eyes, ears mouth and genitals (private parts).                       Wash face,  Genitals (private parts) with your normal soap.             6.  Wash thoroughly, paying special  attention to the area where your surgery  will be performed.  7.  Thoroughly rinse your body with warm water from the neck down.  8.  DO NOT shower/wash with your normal soap after using and rinsing off  the CHG Soap.                9.  Pat yourself dry with a clean towel.            10.  Wear clean pajamas.            11.  Place clean sheets on your bed the night of your first shower and do not  sleep with pets. Day of Surgery : Do not apply any lotions/deodorants the morning of surgery.  Please wear clean clothes to the hospital/surgery center.    Incentive Spirometer  An incentive spirometer is a tool that can help keep your lungs clear and active. This tool measures how well you are filling your lungs with each breath. Taking long deep breaths may help reverse or decrease the chance of developing breathing (pulmonary) problems (especially infection) following:  A long period of time when you are unable to move or be active. BEFORE THE PROCEDURE   If the spirometer includes an indicator to show your best effort, your nurse or respiratory therapist will set it to a desired goal.  If possible, sit up straight or lean slightly forward. Try not to slouch.  Hold the incentive spirometer in an upright position. INSTRUCTIONS FOR USE  1. Sit on the edge of your bed if possible, or sit up as far as you can in bed or on a chair. 2. Hold the incentive spirometer in an upright position. 3. Breathe out normally. 4. Place the mouthpiece in your mouth  and seal your lips tightly around it. 5. Breathe in slowly and as deeply as possible, raising the piston or the ball toward the top of the column. 6. Hold your breath for 3-5 seconds or for as long as possible. Allow the piston or ball to fall to the bottom of the column. 7. Remove the mouthpiece from your mouth and breathe out normally. 8. Rest for a few seconds and repeat Steps 1 through 7 at least 10 times every 1-2 hours when you are awake. Take  your time and take a few normal breaths between deep breaths. 9. The spirometer may include an indicator to show your best effort. Use the indicator as a goal to work toward during each repetition. 10. After each set of 10 deep breaths, practice coughing to be sure your lungs are clear. If you have an incision (the cut made at the time of surgery), support your incision when coughing by placing a pillow or rolled up towels firmly against it. Once you are able to get out of bed, walk around indoors and cough well. You may stop using the incentive spirometer when instructed by your caregiver.  RISKS AND COMPLICATIONS  Take your time so you do not get dizzy or light-headed.  If you are in pain, you may need to take or ask for pain medication before doing incentive spirometry. It is harder to take a deep breath if you are having pain. AFTER USE  Rest and breathe slowly and easily.  It can be helpful to keep track of a log of your progress. Your caregiver can provide you with a simple table to help with this. If you are using the spirometer at home, follow these instructions: SEEK MEDICAL CARE IF:   You are having difficultly using the spirometer.  You have trouble using the spirometer as often as instructed.  Your pain medication is not giving enough relief while using the spirometer.  You develop fever of 100.5 F (38.1 C) or higher. SEEK IMMEDIATE MEDICAL CARE IF:   You cough up bloody sputum that had not been present before.  You develop fever of 102 F (38.9 C) or greater.  You develop worsening pain at or near the incision site. MAKE SURE YOU:   Understand these instructions.  Will watch your condition.  Will get help right away if you are not doing well or get worse. Document Released: 05/23/2006 Document Revised: 04/04/2011 Document Reviewed: 07/24/2006 [facility] Patient Information 2014 [place],  [place].   ________________________________________________________________________

## 2017-10-05 ENCOUNTER — Encounter (HOSPITAL_COMMUNITY): Payer: Self-pay

## 2017-10-05 ENCOUNTER — Other Ambulatory Visit: Payer: Self-pay

## 2017-10-05 ENCOUNTER — Encounter (HOSPITAL_COMMUNITY)
Admission: RE | Admit: 2017-10-05 | Discharge: 2017-10-05 | Disposition: A | Discharge status: Home / Self Care | Payer: Commercial Managed Care - PPO | Source: Ambulatory Visit | Attending: Orthopedic Surgery | Admitting: Orthopedic Surgery

## 2017-10-05 DIAGNOSIS — Z01812 Encounter for preprocedural laboratory examination: Secondary | ICD-10-CM | POA: Insufficient documentation

## 2017-10-05 DIAGNOSIS — M1712 Unilateral primary osteoarthritis, left knee: Secondary | ICD-10-CM | POA: Diagnosis not present

## 2017-10-05 HISTORY — DX: Hypothyroidism, unspecified: E03.9

## 2017-10-05 HISTORY — DX: Anxiety disorder, unspecified: F41.9

## 2017-10-05 HISTORY — DX: Type 2 diabetes mellitus without complications: E11.9

## 2017-10-05 LAB — CBC WITH DIFFERENTIAL/PLATELET
BASOS PCT: 0 %
Basophils Absolute: 0 10*3/uL (ref 0.0–0.1)
EOS ABS: 0.2 10*3/uL (ref 0.0–0.7)
EOS PCT: 2 %
HCT: 42.6 % (ref 36.0–46.0)
HEMOGLOBIN: 14.4 g/dL (ref 12.0–15.0)
Lymphocytes Relative: 42 %
Lymphs Abs: 3.9 10*3/uL (ref 0.7–4.0)
MCH: 31.2 pg (ref 26.0–34.0)
MCHC: 33.8 g/dL (ref 30.0–36.0)
MCV: 92.4 fL (ref 78.0–100.0)
MONO ABS: 0.7 10*3/uL (ref 0.1–1.0)
MONOS PCT: 7 %
NEUTROS PCT: 49 %
Neutro Abs: 4.5 10*3/uL (ref 1.7–7.7)
PLATELETS: 273 10*3/uL (ref 150–400)
RBC: 4.61 MIL/uL (ref 3.87–5.11)
RDW: 13.2 % (ref 11.5–15.5)
WBC: 9.2 10*3/uL (ref 4.0–10.5)

## 2017-10-05 LAB — COMPREHENSIVE METABOLIC PANEL
ALBUMIN: 4.6 g/dL (ref 3.5–5.0)
ALT: 26 U/L (ref 0–44)
ANION GAP: 9 (ref 5–15)
AST: 31 U/L (ref 15–41)
Alkaline Phosphatase: 61 U/L (ref 38–126)
BUN: 12 mg/dL (ref 8–23)
CHLORIDE: 108 mmol/L (ref 98–111)
CO2: 24 mmol/L (ref 22–32)
Calcium: 9.8 mg/dL (ref 8.9–10.3)
Creatinine, Ser: 0.71 mg/dL (ref 0.44–1.00)
GFR calc Af Amer: >60 mL/min (ref >60)
GFR calc non Af Amer: >60 mL/min (ref >60)
Glucose, Bld: 93 mg/dL (ref 70–99)
POTASSIUM: 4.8 mmol/L (ref 3.5–5.1)
SODIUM: 141 mmol/L (ref 135–145)
Total Bilirubin: 0.4 mg/dL (ref 0.3–1.2)
Total Protein: 7.4 g/dL (ref 6.5–8.1)

## 2017-10-05 LAB — SURGICAL PCR SCREEN
MRSA, PCR: NEGATIVE
Staphylococcus aureus: NEGATIVE

## 2017-10-05 LAB — GLUCOSE, CAPILLARY: Glucose-Capillary: 81 mg/dL (ref 70–99)

## 2017-10-05 NOTE — Progress Notes (Signed)
ekg 09-04-17 [facility] on chart chest xray 09-04-17 [facility] on chart hemaglobin A1C [facility] on chart

## 2017-10-08 MED ORDER — BUPIVACAINE LIPOSOME 1.3 % IJ SUSP
20.0000 mL | Freq: Once | INTRAMUSCULAR | Status: DC
  Filled 2017-10-08: qty 20

## 2017-10-09 ENCOUNTER — Other Ambulatory Visit: Payer: Self-pay

## 2017-10-09 ENCOUNTER — Encounter (HOSPITAL_COMMUNITY): Payer: Self-pay | Admitting: *Deleted

## 2017-10-09 ENCOUNTER — Observation Stay (HOSPITAL_COMMUNITY)
Admission: RE | Admit: 2017-10-09 | Discharge: 2017-10-10 | Disposition: A | Discharge status: Home / Self Care | Payer: Commercial Managed Care - PPO | Source: Ambulatory Visit | Attending: Orthopedic Surgery | Admitting: Orthopedic Surgery

## 2017-10-09 ENCOUNTER — Ambulatory Visit (HOSPITAL_COMMUNITY): Payer: Commercial Managed Care - PPO | Admitting: Anesthesiology

## 2017-10-09 ENCOUNTER — Encounter (HOSPITAL_COMMUNITY)
Admission: RE | Disposition: A | Discharge status: Home / Self Care | Payer: Self-pay | Source: Ambulatory Visit | Attending: Orthopedic Surgery

## 2017-10-09 DIAGNOSIS — M797 Fibromyalgia: Secondary | ICD-10-CM | POA: Insufficient documentation

## 2017-10-09 DIAGNOSIS — M1712 Unilateral primary osteoarthritis, left knee: Principal | ICD-10-CM | POA: Insufficient documentation

## 2017-10-09 DIAGNOSIS — Z881 Allergy status to other antibiotic agents status: Secondary | ICD-10-CM | POA: Diagnosis not present

## 2017-10-09 DIAGNOSIS — I10 Essential (primary) hypertension: Secondary | ICD-10-CM | POA: Diagnosis not present

## 2017-10-09 DIAGNOSIS — Z7984 Long term (current) use of oral hypoglycemic drugs: Secondary | ICD-10-CM | POA: Diagnosis not present

## 2017-10-09 DIAGNOSIS — F419 Anxiety disorder, unspecified: Secondary | ICD-10-CM | POA: Insufficient documentation

## 2017-10-09 DIAGNOSIS — Z87891 Personal history of nicotine dependence: Secondary | ICD-10-CM | POA: Diagnosis not present

## 2017-10-09 DIAGNOSIS — E785 Hyperlipidemia, unspecified: Secondary | ICD-10-CM | POA: Diagnosis not present

## 2017-10-09 DIAGNOSIS — K219 Gastro-esophageal reflux disease without esophagitis: Secondary | ICD-10-CM | POA: Diagnosis not present

## 2017-10-09 DIAGNOSIS — E119 Type 2 diabetes mellitus without complications: Secondary | ICD-10-CM | POA: Diagnosis not present

## 2017-10-09 DIAGNOSIS — Z79899 Other long term (current) drug therapy: Secondary | ICD-10-CM | POA: Diagnosis not present

## 2017-10-09 DIAGNOSIS — Z7982 Long term (current) use of aspirin: Secondary | ICD-10-CM | POA: Insufficient documentation

## 2017-10-09 DIAGNOSIS — D649 Anemia, unspecified: Secondary | ICD-10-CM | POA: Insufficient documentation

## 2017-10-09 DIAGNOSIS — Z96659 Presence of unspecified artificial knee joint: Secondary | ICD-10-CM

## 2017-10-09 DIAGNOSIS — F329 Major depressive disorder, single episode, unspecified: Secondary | ICD-10-CM | POA: Insufficient documentation

## 2017-10-09 DIAGNOSIS — E039 Hypothyroidism, unspecified: Secondary | ICD-10-CM | POA: Insufficient documentation

## 2017-10-09 DIAGNOSIS — I2510 Atherosclerotic heart disease of native coronary artery without angina pectoris: Secondary | ICD-10-CM | POA: Insufficient documentation

## 2017-10-09 HISTORY — PX: TOTAL KNEE ARTHROPLASTY: SHX125

## 2017-10-09 LAB — GLUCOSE, CAPILLARY
GLUCOSE-CAPILLARY: 95 mg/dL (ref 70–99)
GLUCOSE-CAPILLARY: 207 mg/dL — AB (ref 70–99)

## 2017-10-09 SURGERY — ARTHROPLASTY, KNEE, TOTAL
Anesthesia: Spinal | Site: Knee | Laterality: Left

## 2017-10-09 MED ORDER — OXYCODONE HCL 5 MG PO TABS
5.0000 mg | ORAL_TABLET | ORAL | Status: DC | PRN
  Administered 2017-10-09 – 2017-10-10 (×5): 10 mg via ORAL
  Filled 2017-10-09 (×5): qty 2

## 2017-10-09 MED ORDER — PROPOFOL 500 MG/50ML IV EMUL
INTRAVENOUS | Status: DC | PRN
  Administered 2017-10-09: 100 ug/kg/min via INTRAVENOUS

## 2017-10-09 MED ORDER — METFORMIN HCL 500 MG PO TABS
500.0000 mg | ORAL_TABLET | Freq: Two times a day (BID) | ORAL | Status: DC
  Administered 2017-10-10: 500 mg via ORAL
  Filled 2017-10-09: qty 1

## 2017-10-09 MED ORDER — ACETAMINOPHEN 500 MG PO TABS
1000.0000 mg | ORAL_TABLET | Freq: Four times a day (QID) | ORAL | Status: AC
  Administered 2017-10-09 – 2017-10-10 (×4): 1000 mg via ORAL
  Filled 2017-10-09 (×4): qty 2

## 2017-10-09 MED ORDER — PANTOPRAZOLE SODIUM 20 MG PO TBEC: 20.0000 mg | DELAYED_RELEASE_TABLET | Freq: Every day | ORAL | Status: DC

## 2017-10-09 MED ORDER — METOCLOPRAMIDE HCL 5 MG/ML IJ SOLN: 5.0000 mg | Freq: Three times a day (TID) | INTRAMUSCULAR | Status: DC | PRN

## 2017-10-09 MED ORDER — BUPIVACAINE IN DEXTROSE 0.75-8.25 % IT SOLN
INTRATHECAL | Status: DC | PRN
  Administered 2017-10-09: 1.8 mL via INTRATHECAL

## 2017-10-09 MED ORDER — ONDANSETRON HCL 4 MG PO TABS
4.0000 mg | ORAL_TABLET | Freq: Four times a day (QID) | ORAL | Status: DC | PRN
  Administered 2017-10-09: 4 mg via ORAL
  Filled 2017-10-09: qty 1

## 2017-10-09 MED ORDER — CHLORHEXIDINE GLUCONATE 4 % EX LIQD
60.0000 mL | Freq: Once | CUTANEOUS | Status: AC
  Administered 2017-10-09: 4 via TOPICAL

## 2017-10-09 MED ORDER — TRANEXAMIC ACID 1000 MG/10ML IV SOLN
1000.0000 mg | Freq: Once | INTRAVENOUS | Status: AC
  Administered 2017-10-09: 1000 mg via INTRAVENOUS
  Filled 2017-10-09: qty 1000

## 2017-10-09 MED ORDER — METOCLOPRAMIDE HCL 5 MG PO TABS: 5.0000 mg | ORAL_TABLET | Freq: Three times a day (TID) | ORAL | Status: DC | PRN

## 2017-10-09 MED ORDER — VORTIOXETINE HBR 5 MG PO TABS
10.0000 mg | ORAL_TABLET | Freq: Every day | ORAL | Status: DC
  Administered 2017-10-10: 10 mg via ORAL
  Filled 2017-10-09: qty 2

## 2017-10-09 MED ORDER — PANTOPRAZOLE SODIUM 40 MG PO TBEC
40.0000 mg | DELAYED_RELEASE_TABLET | Freq: Every day | ORAL | Status: DC
  Administered 2017-10-09 – 2017-10-10 (×2): 40 mg via ORAL
  Filled 2017-10-09 (×2): qty 1

## 2017-10-09 MED ORDER — STERILE WATER FOR IRRIGATION IR SOLN
Status: DC | PRN
  Administered 2017-10-09: 2000 mL

## 2017-10-09 MED ORDER — BUPIVACAINE LIPOSOME 1.3 % IJ SUSP
INTRAMUSCULAR | Status: DC | PRN
  Administered 2017-10-09: 20 mL

## 2017-10-09 MED ORDER — GABAPENTIN 300 MG PO CAPS
300.0000 mg | ORAL_CAPSULE | Freq: Once | ORAL | Status: AC
  Administered 2017-10-09: 300 mg via ORAL
  Filled 2017-10-09: qty 1

## 2017-10-09 MED ORDER — METHOCARBAMOL 500 MG PO TABS
500.0000 mg | ORAL_TABLET | Freq: Four times a day (QID) | ORAL | Status: DC | PRN
  Administered 2017-10-10 (×3): 500 mg via ORAL
  Filled 2017-10-09 (×3): qty 1

## 2017-10-09 MED ORDER — LACTATED RINGERS IV SOLN
INTRAVENOUS | Status: DC
  Administered 2017-10-09 (×2): via INTRAVENOUS

## 2017-10-09 MED ORDER — CEFAZOLIN SODIUM-DEXTROSE 2-4 GM/100ML-% IV SOLN
2.0000 g | Freq: Four times a day (QID) | INTRAVENOUS | Status: AC
  Administered 2017-10-09 – 2017-10-10 (×2): 2 g via INTRAVENOUS
  Filled 2017-10-09 (×2): qty 100

## 2017-10-09 MED ORDER — ROSUVASTATIN CALCIUM 10 MG PO TABS
10.0000 mg | ORAL_TABLET | Freq: Every day | ORAL | Status: DC
  Administered 2017-10-09: 10 mg via ORAL
  Filled 2017-10-09: qty 1

## 2017-10-09 MED ORDER — TOPIRAMATE 25 MG PO TABS
50.0000 mg | ORAL_TABLET | Freq: Two times a day (BID) | ORAL | Status: DC
  Administered 2017-10-09 – 2017-10-10 (×2): 50 mg via ORAL
  Filled 2017-10-09 (×2): qty 2

## 2017-10-09 MED ORDER — METHOCARBAMOL 500 MG IVPB - SIMPLE MED
500.0000 mg | Freq: Four times a day (QID) | INTRAVENOUS | Status: DC | PRN
  Administered 2017-10-09: 500 mg via INTRAVENOUS
  Filled 2017-10-09: qty 50

## 2017-10-09 MED ORDER — METHOCARBAMOL 500 MG IVPB - SIMPLE MED
INTRAVENOUS | Status: AC
  Filled 2017-10-09: qty 50

## 2017-10-09 MED ORDER — OXYCODONE HCL 5 MG PO TABS: 5.0000 mg | ORAL_TABLET | Freq: Once | ORAL | Status: DC | PRN

## 2017-10-09 MED ORDER — SODIUM CHLORIDE 0.9% FLUSH
INTRAVENOUS | Status: DC | PRN
  Administered 2017-10-09: 20 mL

## 2017-10-09 MED ORDER — DEXAMETHASONE SODIUM PHOSPHATE 10 MG/ML IJ SOLN
10.0000 mg | Freq: Once | INTRAMUSCULAR | Status: AC
  Administered 2017-10-10: 10 mg via INTRAVENOUS
  Filled 2017-10-09: qty 1

## 2017-10-09 MED ORDER — ACETAMINOPHEN 500 MG PO TABS
1000.0000 mg | ORAL_TABLET | Freq: Once | ORAL | Status: AC
  Administered 2017-10-09: 1000 mg via ORAL
  Filled 2017-10-09: qty 2

## 2017-10-09 MED ORDER — ONDANSETRON HCL 4 MG/2ML IJ SOLN: 4.0000 mg | Freq: Four times a day (QID) | INTRAMUSCULAR | Status: DC | PRN

## 2017-10-09 MED ORDER — ROPIVACAINE HCL 7.5 MG/ML IJ SOLN
INTRAMUSCULAR | Status: DC | PRN
  Administered 2017-10-09: 20 mL via PERINEURAL

## 2017-10-09 MED ORDER — PROPOFOL 10 MG/ML IV BOLUS
INTRAVENOUS | Status: AC
  Filled 2017-10-09: qty 40

## 2017-10-09 MED ORDER — LIDOCAINE 2% (20 MG/ML) 5 ML SYRINGE
INTRAMUSCULAR | Status: DC | PRN
  Administered 2017-10-09: 60 mg via INTRAVENOUS

## 2017-10-09 MED ORDER — BUPIVACAINE-EPINEPHRINE 0.5% -1:200000 IJ SOLN
INTRAMUSCULAR | Status: DC | PRN
  Administered 2017-10-09: 30 mL

## 2017-10-09 MED ORDER — BISACODYL 5 MG PO TBEC: 5.0000 mg | DELAYED_RELEASE_TABLET | Freq: Every day | ORAL | Status: DC | PRN

## 2017-10-09 MED ORDER — ALUM & MAG HYDROXIDE-SIMETH 200-200-20 MG/5ML PO SUSP: 30.0000 mL | ORAL | Status: DC | PRN

## 2017-10-09 MED ORDER — MIDAZOLAM HCL 2 MG/2ML IJ SOLN
2.0000 mg | Freq: Once | INTRAMUSCULAR | Status: AC
  Administered 2017-10-09: 1 mg via INTRAVENOUS
  Filled 2017-10-09: qty 2

## 2017-10-09 MED ORDER — CEFAZOLIN SODIUM-DEXTROSE 2-4 GM/100ML-% IV SOLN
2.0000 g | INTRAVENOUS | Status: AC
  Administered 2017-10-09: 2 g via INTRAVENOUS
  Filled 2017-10-09: qty 100

## 2017-10-09 MED ORDER — EPHEDRINE SULFATE-NACL 50-0.9 MG/10ML-% IV SOSY
PREFILLED_SYRINGE | INTRAVENOUS | Status: DC | PRN
  Administered 2017-10-09 (×2): 10 mg via INTRAVENOUS

## 2017-10-09 MED ORDER — DOCUSATE SODIUM 100 MG PO CAPS
100.0000 mg | ORAL_CAPSULE | Freq: Two times a day (BID) | ORAL | Status: DC
  Administered 2017-10-09 – 2017-10-10 (×2): 100 mg via ORAL
  Filled 2017-10-09 (×2): qty 1

## 2017-10-09 MED ORDER — DEXAMETHASONE SODIUM PHOSPHATE 10 MG/ML IJ SOLN: 8.0000 mg | Freq: Once | INTRAMUSCULAR | Status: DC

## 2017-10-09 MED ORDER — BUPIVACAINE-EPINEPHRINE (PF) 0.5% -1:200000 IJ SOLN
INTRAMUSCULAR | Status: AC
  Filled 2017-10-09: qty 30

## 2017-10-09 MED ORDER — ASPIRIN EC 325 MG PO TBEC
325.0000 mg | DELAYED_RELEASE_TABLET | Freq: Two times a day (BID) | ORAL | Status: DC
  Administered 2017-10-10: 325 mg via ORAL
  Filled 2017-10-09: qty 1

## 2017-10-09 MED ORDER — FLEET ENEMA 7-19 GM/118ML RE ENEM: 1.0000 | ENEMA | Freq: Once | RECTAL | Status: DC | PRN

## 2017-10-09 MED ORDER — SENNOSIDES-DOCUSATE SODIUM 8.6-50 MG PO TABS: 1.0000 | ORAL_TABLET | Freq: Every evening | ORAL | Status: DC | PRN

## 2017-10-09 MED ORDER — ZOLPIDEM TARTRATE 5 MG PO TABS: 5.0000 mg | ORAL_TABLET | Freq: Every evening | ORAL | Status: DC | PRN

## 2017-10-09 MED ORDER — LEVOTHYROXINE SODIUM 50 MCG PO TABS
50.0000 ug | ORAL_TABLET | Freq: Every day | ORAL | Status: DC
  Administered 2017-10-10: 50 ug via ORAL
  Filled 2017-10-09: qty 1

## 2017-10-09 MED ORDER — FENTANYL CITRATE (PF) 100 MCG/2ML IJ SOLN
100.0000 ug | Freq: Once | INTRAMUSCULAR | Status: AC
  Administered 2017-10-09: 50 ug via INTRAVENOUS
  Filled 2017-10-09: qty 2

## 2017-10-09 MED ORDER — PROPOFOL 10 MG/ML IV BOLUS
INTRAVENOUS | Status: DC | PRN
  Administered 2017-10-09 (×2): 30 mg via INTRAVENOUS

## 2017-10-09 MED ORDER — TRAMADOL HCL 50 MG PO TABS
50.0000 mg | ORAL_TABLET | Freq: Four times a day (QID) | ORAL | Status: DC
  Administered 2017-10-09 – 2017-10-10 (×4): 50 mg via ORAL
  Filled 2017-10-09 (×4): qty 1

## 2017-10-09 MED ORDER — MENTHOL 3 MG MT LOZG: 1.0000 | LOZENGE | OROMUCOSAL | Status: DC | PRN

## 2017-10-09 MED ORDER — SODIUM CHLORIDE 0.9 % IJ SOLN
INTRAMUSCULAR | Status: AC
  Filled 2017-10-09: qty 20

## 2017-10-09 MED ORDER — ONDANSETRON HCL 4 MG/2ML IJ SOLN: 4.0000 mg | Freq: Once | INTRAMUSCULAR | Status: DC | PRN

## 2017-10-09 MED ORDER — LIDOCAINE 2% (20 MG/ML) 5 ML SYRINGE
INTRAMUSCULAR | Status: AC
  Filled 2017-10-09: qty 5

## 2017-10-09 MED ORDER — GABAPENTIN 600 MG PO TABS: 300.0000 mg | ORAL_TABLET | Freq: Two times a day (BID) | ORAL | Status: DC

## 2017-10-09 MED ORDER — TRANEXAMIC ACID 1000 MG/10ML IV SOLN
1000.0000 mg | INTRAVENOUS | Status: AC
  Administered 2017-10-09: 1000 mg via INTRAVENOUS
  Filled 2017-10-09: qty 10

## 2017-10-09 MED ORDER — BUSPIRONE HCL 5 MG PO TABS
10.0000 mg | ORAL_TABLET | Freq: Two times a day (BID) | ORAL | Status: DC
  Administered 2017-10-09 – 2017-10-10 (×2): 10 mg via ORAL
  Filled 2017-10-09 (×2): qty 2

## 2017-10-09 MED ORDER — PHENOL 1.4 % MT LIQD: 1.0000 | OROMUCOSAL | Status: DC | PRN

## 2017-10-09 MED ORDER — 0.9 % SODIUM CHLORIDE (POUR BTL) OPTIME
TOPICAL | Status: DC | PRN
  Administered 2017-10-09: 1000 mL

## 2017-10-09 MED ORDER — DIPHENHYDRAMINE HCL 12.5 MG/5ML PO ELIX: 12.5000 mg | ORAL_SOLUTION | ORAL | Status: DC | PRN

## 2017-10-09 MED ORDER — HYDROMORPHONE HCL 1 MG/ML IJ SOLN: 0.5000 mg | INTRAMUSCULAR | Status: DC | PRN

## 2017-10-09 MED ORDER — GABAPENTIN 300 MG PO CAPS
300.0000 mg | ORAL_CAPSULE | Freq: Three times a day (TID) | ORAL | Status: DC
  Administered 2017-10-09 – 2017-10-10 (×3): 300 mg via ORAL
  Filled 2017-10-09 (×3): qty 1

## 2017-10-09 MED ORDER — SODIUM CHLORIDE 0.9 % IR SOLN
Status: DC | PRN
  Administered 2017-10-09: 1000 mL

## 2017-10-09 MED ORDER — OXYCODONE HCL 5 MG/5ML PO SOLN
5.0000 mg | Freq: Once | ORAL | Status: DC | PRN
  Filled 2017-10-09: qty 5

## 2017-10-09 MED ORDER — SODIUM CHLORIDE 0.9 % IV SOLN
INTRAVENOUS | Status: DC
  Administered 2017-10-09: 19:00:00 via INTRAVENOUS

## 2017-10-09 MED ORDER — FENTANYL CITRATE (PF) 100 MCG/2ML IJ SOLN
25.0000 ug | INTRAMUSCULAR | Status: DC | PRN
  Administered 2017-10-09: 50 ug via INTRAVENOUS

## 2017-10-09 MED ORDER — FERROUS SULFATE 325 (65 FE) MG PO TABS
325.0000 mg | ORAL_TABLET | Freq: Three times a day (TID) | ORAL | Status: DC
  Administered 2017-10-10 (×2): 325 mg via ORAL
  Filled 2017-10-09 (×2): qty 1

## 2017-10-09 MED ORDER — FENTANYL CITRATE (PF) 100 MCG/2ML IJ SOLN
INTRAMUSCULAR | Status: AC
  Filled 2017-10-09: qty 2

## 2017-10-09 SURGICAL SUPPLY — 64 items
ARTISURF 10M PLY L 6-9EF KNEE (Knees) ×3 IMPLANT
BAG SPEC THK2 15X12 ZIP CLS (MISCELLANEOUS) ×1
BAG ZIPLOCK 12X15 (MISCELLANEOUS) ×3 IMPLANT
BANDAGE ACE 6X5 VEL STRL LF (GAUZE/BANDAGES/DRESSINGS) ×3 IMPLANT
BLADE SAGITTAL 13X1.27X60 (BLADE) ×2 IMPLANT
BLADE SAGITTAL 13X1.27X60MM (BLADE) ×1
BLADE SAW SGTL 83.5X18.5 (BLADE) ×3 IMPLANT
BLADE SURG 15 STRL LF DISP TIS (BLADE) ×1 IMPLANT
BLADE SURG 15 STRL SS (BLADE) ×3
BOWL SMART MIX CTS (DISPOSABLE) ×3 IMPLANT
BSPLAT TIB 5D E CMNT STM LT (Knees) ×1 IMPLANT
CEMENT BONE SIMPLEX SPEEDSET (Cement) ×6 IMPLANT
CLOSURE STERI-STRIP 1/4X4 (GAUZE/BANDAGES/DRESSINGS) ×3 IMPLANT
CLOSURE WOUND 1/2 X4 (GAUZE/BANDAGES/DRESSINGS) ×1
COVER SURGICAL LIGHT HANDLE (MISCELLANEOUS) ×3 IMPLANT
CUFF TOURN SGL QUICK 34 (TOURNIQUET CUFF) ×2
CUFF TRNQT CYL 34X4X40X1 (TOURNIQUET CUFF) ×1 IMPLANT
DECANTER SPIKE VIAL GLASS SM (MISCELLANEOUS) ×3 IMPLANT
DRAPE INCISE IOBAN 66X45 STRL (DRAPES) ×6 IMPLANT
DRAPE U-SHAPE 47X51 STRL (DRAPES) ×3 IMPLANT
DRSG AQUACEL AG ADV 3.5X10 (GAUZE/BANDAGES/DRESSINGS) ×3 IMPLANT
DURAPREP 26ML APPLICATOR (WOUND CARE) ×6 IMPLANT
ELECT REM PT RETURN 15FT ADLT (MISCELLANEOUS) ×3 IMPLANT
FEMUR  CMT CCR STD SZ8 L KNEE (Knees) ×2 IMPLANT
FEMUR CMT CCR STD SZ8 L KNEE (Knees) ×1 IMPLANT
FEMUR CMTD CCR STD SZ8 L KNEE (Knees) ×1 IMPLANT
GLOVE BIOGEL M 7.0 STRL (GLOVE) ×3 IMPLANT
GLOVE BIOGEL M STRL SZ7.5 (GLOVE) ×3 IMPLANT
GLOVE BIOGEL PI IND STRL 7.5 (GLOVE) ×1 IMPLANT
GLOVE BIOGEL PI IND STRL 8.5 (GLOVE) ×1 IMPLANT
GLOVE BIOGEL PI INDICATOR 7.5 (GLOVE) ×2
GLOVE BIOGEL PI INDICATOR 8.5 (GLOVE) ×2
GLOVE SURG ORTHO 8.0 STRL STRW (GLOVE) ×6 IMPLANT
GOWN STRL REUS W/ TWL XL LVL3 (GOWN DISPOSABLE) ×1 IMPLANT
GOWN STRL REUS W/TWL 2XL LVL3 (GOWN DISPOSABLE) ×3 IMPLANT
GOWN STRL REUS W/TWL XL LVL3 (GOWN DISPOSABLE) ×3
HANDPIECE INTERPULSE COAX TIP (DISPOSABLE) ×2
HOLDER FOLEY CATH W/STRAP (MISCELLANEOUS) ×3 IMPLANT
HOOD PEEL AWAY FLYTE STAYCOOL (MISCELLANEOUS) ×9 IMPLANT
MANIFOLD NEPTUNE II (INSTRUMENTS) ×3 IMPLANT
NS IRRIG 1000ML POUR BTL (IV SOLUTION) ×3 IMPLANT
PACK TOTAL KNEE CUSTOM (KITS) ×3 IMPLANT
POSITIONER SURGICAL ARM (MISCELLANEOUS) ×3 IMPLANT
SET HNDPC FAN SPRY TIP SCT (DISPOSABLE) ×1 IMPLANT
SPONGE LAP 18X18 RF (DISPOSABLE) IMPLANT
STEM POLY PAT PLY 32M KNEE (Knees) ×3 IMPLANT
STEM TIBIA 5 DEG SZ E L KNEE (Knees) ×1 IMPLANT
STRIP CLOSURE SKIN 1/2X4 (GAUZE/BANDAGES/DRESSINGS) ×2 IMPLANT
SUT BONE WAX W31G (SUTURE) ×3 IMPLANT
SUT MNCRL AB 4-0 PS2 18 (SUTURE) ×3 IMPLANT
SUT STRATAFIX PDS+ 0 24IN (SUTURE) ×3 IMPLANT
SUT VIC AB 0 CT1 36 (SUTURE) ×3 IMPLANT
SUT VIC AB 1 CT1 27 (SUTURE) ×2
SUT VIC AB 1 CT1 27XBRD ANTBC (SUTURE) ×1 IMPLANT
SUT VIC AB 2-0 CT1 27 (SUTURE) ×3
SUT VIC AB 2-0 CT1 TAPERPNT 27 (SUTURE) ×1 IMPLANT
SYR CONTROL 10ML LL (SYRINGE) ×6 IMPLANT
TIBIA STEM 5 DEG SZ E L KNEE (Knees) ×3 IMPLANT
TRAY CATH 16FR W/PLASTIC CATH (SET/KITS/TRAYS/PACK) IMPLANT
TRAY FOLEY CATH 14FRSI W/METER (CATHETERS) ×3 IMPLANT
TRAY FOLEY MTR SLVR 16FR STAT (SET/KITS/TRAYS/PACK) IMPLANT
WATER STERILE IRR 1000ML POUR (IV SOLUTION) ×6 IMPLANT
WRAP KNEE MAXI GEL POST OP (GAUZE/BANDAGES/DRESSINGS) ×3 IMPLANT
YANKAUER SUCT BULB TIP 10FT TU (MISCELLANEOUS) ×3 IMPLANT

## 2017-10-09 NOTE — Anesthesia Preprocedure Evaluation (Addendum)
Anesthesia Evaluation  Patient identified by MRN, date of birth, ID band Patient awake    Reviewed: Allergy & Precautions, NPO status , Patient's Chart, lab work & pertinent test results  History of Anesthesia Complications (+) PROLONGED EMERGENCE and history of anesthetic complications  Airway Mallampati: II  TM Distance: >3 FB Neck ROM: Full    Dental  (+) Teeth Intact   Pulmonary former smoker,    breath sounds clear to auscultation       Cardiovascular hypertension,  Rhythm:Regular Rate:Normal     Neuro/Psych  Headaches, PSYCHIATRIC DISORDERS Anxiety Depression    GI/Hepatic Neg liver ROS, GERD  Medicated,  Endo/Other  diabetes, Type 2, Oral Hypoglycemic AgentsHypothyroidism   Renal/GU negative Renal ROS  negative genitourinary   Musculoskeletal  (+) Arthritis , Fibromyalgia - Muscle spasms Chronic back pain   Abdominal   Peds  Hematology  (+) anemia ,   Anesthesia Other Findings   Reproductive/Obstetrics                            Anesthesia Physical Anesthesia Plan  ASA: II  Anesthesia Plan: Spinal   Post-op Pain Management:  Regional for Post-op pain   Induction:   PONV Risk Score and Plan: 2 and Treatment may vary due to age or medical condition and Propofol infusion  Airway Management Planned: Natural Airway and Simple Face Mask  Additional Equipment: None  Intra-op Plan:   Post-operative Plan:   Informed Consent: I have reviewed the patients History and Physical, chart, labs and discussed the procedure including the risks, benefits and alternatives for the proposed anesthesia with the patient or authorized representative who has indicated his/her understanding and acceptance.     Plan Discussed with: CRNA and Anesthesiologist  Anesthesia Plan Comments: (Labs reviewed, platelets acceptable. Discussed risks and benefits of spinal, including spinal/epidural  hematoma, infection, failed block, and PDPH. Patient expressed understanding and wished to proceed. )       Anesthesia Quick Evaluation

## 2017-10-09 NOTE — Progress Notes (Signed)
AssistedDr. Brock with left, ultrasound guided, adductor canal block. Side rails up, monitors on throughout procedure. See vital signs in flow sheet. Tolerated Procedure well.  

## 2017-10-09 NOTE — Anesthesia Procedure Notes (Signed)
Spinal  End time: 10/09/2017 3:00 PM Staffing Resident/CRNA: Caryl Pina T, CRNA Performed: resident/CRNA  Preanesthetic Checklist Completed: patient identified, site marked, surgical consent, pre-op evaluation, timeout performed, IV checked, risks and benefits discussed and monitors and equipment checked Spinal Block Patient position: sitting Prep: DuraPrep Patient monitoring: heart rate, cardiac monitor, continuous pulse ox and blood pressure Approach: midline Location: L3-4 Injection technique: single-shot Needle Needle type: Pencan  Needle gauge: 24 G Needle length: 9 cm Assessment Sensory level: T6 Additional Notes Expiration date of kit checked and confirmed. Patient tolerated procedure well, without complications.

## 2017-10-09 NOTE — Anesthesia Procedure Notes (Signed)
Anesthesia Regional Block: Adductor canal block   Pre-Anesthetic Checklist: ,, timeout performed, Correct Patient, Correct Site, Correct Laterality, Correct Procedure, Correct Position, site marked, Risks and benefits discussed,  Surgical consent,  Pre-op evaluation,  At surgeon's request and post-op pain management  Laterality: Left  Prep: chloraprep       Needles:  Injection technique: Single-shot  Needle Type: Echogenic Needle     Needle Length: 10cm  Needle Gauge: 21     Additional Needles:   Narrative:  Start time: 10/09/2017 1:09 PM End time: 10/09/2017 1:12 PM Injection made incrementally with aspirations every 5 mL.  Performed by: Personally  Anesthesiologist: Beryle LatheBrock, Thomas E, MD  Additional Notes: No pain on injection. No increased resistance to injection. Injection made in 5cc increments. Good needle visualization. Patient tolerated the procedure well.

## 2017-10-09 NOTE — H&P (Signed)
PAMLEA FINDER MRN:  [id number] DOB/SEX:  Apr 23, 1955/female  CHIEF COMPLAINT:  Painful left Knee  HISTORY: Patient is a 62 y.o. female presented with a history of pain in the left knee. Onset of symptoms was gradual starting 10 years ago with gradually worsening course since that time. Patient has been treated conservatively with over-the-counter NSAIDs and activity modification. Patient currently rates pain in the knee at 10 out of 10 with activity. There is pain at night.  PAST MEDICAL HISTORY: Patient Active Problem List   Diagnosis Date Noted  . Pain, knee 05/20/2013   Past Medical History:  Diagnosis Date  . Anemia   . Anxiety   . Arthritis   . Chronic back pain   . Complication of anesthesia    slow to wake up after one of her surgeries, blood pressure dropped with one surgery 3 yrs ago  . Constipation    takes OTC stool softener  . Depression    takes Celexa daily  . Diabetes mellitus without complication (HCC)    type 2  . Diverticulosis   . Fibromyalgia   . GERD (gastroesophageal reflux disease)    takes Protonix daily  . History of colon polyps   . History of migraine    takes Topamax nightly;aurora  . Hyperlipidemia    takes Crestor daily  . Hypertension    hx of and has been off meds for yrs--lost weight  . Hypothyroidism    small thryoid nodule checked every few years  . Joint pain   . Muscle spasm    takes Zanaflex daily  . Nasal congestion    uses Dymista daily as needed  . Yeast infection    was on antibiotic and going to pick up script for this   Past Surgical History:  Procedure Laterality Date  . ABDOMINAL EXPLORATION SURGERY    . APPENDECTOMY    . BACK SURGERY     lower back  . BREAST SURGERY     d/t clogged duct right side  . CARPAL TUNNEL RELEASE Bilateral   . cataract surgery Bilateral   . COLONOSCOPY    . ESOPHAGOGASTRODUODENOSCOPY    . EXCISION MORTON'S NEUROMA    . JOINT REPLACEMENT     right knee replacement  . KNEE  ARTHROSCOPY Bilateral   . NASAL SINUS SURGERY    . STERIOD INJECTION Left 05/20/2013   Procedure: STEROID INJECTION;  Surgeon: Dannielle Huh, MD;  Location: [facility] OR;  Service: Orthopedics;  Laterality: Left;  . TONSILLECTOMY    . TOTAL KNEE REVISION WITH SCAR DEBRIDEMENT/PATELLA REVISION WITH POLY EXCHANGE Right 05/20/2013   Procedure: RIGHT TOTAL KNEE POLY EXCHANGE ;  Surgeon: Dannielle Huh, MD;  Location: [facility] OR;  Service: Orthopedics;  Laterality: Right;     MEDICATIONS:   No medications prior to admission.    ALLERGIES:   Allergies  Allergen Reactions  . Flexeril [Cyclobenzaprine] Hives  . Metronidazole Hives    REVIEW OF SYSTEMS:  A comprehensive review of systems was negative except for: Musculoskeletal: positive for arthralgias and bone pain   FAMILY HISTORY:  No family history on file.  SOCIAL HISTORY:   Social History   Tobacco Use  . Smoking status: Former Smoker    Packs/day: 0.50    Years: 35.00    Pack years: 17.50  . Smokeless tobacco: Never Used  . Tobacco comment: quit dec 2016  Substance Use Topics  . Alcohol use: Yes    Comment: occasionally     EXAMINATION:  Vital signs in last 24 hours:    There were no vitals taken for this visit.  General Appearance:    Alert, cooperative, no distress, appears stated age  Head:    Normocephalic, without obvious abnormality, atraumatic  Eyes:    PERRL, conjunctiva/corneas clear, EOM's intact, fundi    benign, both eyes  Ears:    Normal TM's and external ear canals, both ears  Nose:   Nares normal, septum midline, mucosa normal, no drainage    or sinus tenderness  Throat:   Lips, mucosa, and tongue normal; teeth and gums normal  Neck:   Supple, symmetrical, trachea midline, no adenopathy;    thyroid:  no enlargement/tenderness/nodules; no carotid   bruit or JVD  Back:     Symmetric, no curvature, ROM normal, no CVA tenderness  Lungs:     Clear to auscultation bilaterally, respirations unlabored  Chest Wall:    No  tenderness or deformity   Heart:    Regular rate and rhythm, S1 and S2 normal, no murmur, rub   or gallop  Breast Exam:    No tenderness, masses, or nipple abnormality  Abdomen:     Soft, non-tender, bowel sounds active all four quadrants,    no masses, no organomegaly  Genitalia:    Normal female without lesion, discharge or tenderness  Rectal:    Normal tone, no masses or tenderness;   guaiac negative stool  Extremities:   Extremities normal, atraumatic, no cyanosis or edema  Pulses:   2+ and symmetric all extremities  Skin:   Skin color, texture, turgor normal, no rashes or lesions  Lymph nodes:   Cervical, supraclavicular, and axillary nodes normal  Neurologic:   CNII-XII intact, normal strength, sensation and reflexes    throughout    Musculoskeletal:  ROM 0-120, Ligaments intact,  Imaging Review Plain radiographs demonstrate severe degenerative joint disease of the left knee. The overall alignment is neutral. The bone quality appears to be good for age and reported activity level.  Assessment/Plan: Primary osteoarthritis, left knee   The patient history, physical examination and imaging studies are consistent with advanced degenerative joint disease of the left knee. The patient has failed conservative treatment.  The clearance notes were reviewed.  After discussion with the patient it was felt that Total Knee Replacement was indicated. The procedure,  risks, and benefits of total knee arthroplasty were presented and reviewed. The risks including but not limited to aseptic loosening, infection, blood clots, vascular injury, stiffness, patella tracking problems complications among others were discussed. The patient acknowledged the explanation, agreed to proceed with the plan.  Preoperative templating of the joint replacement has been completed, documented, and submitted to the Operating Room personnel in order to optimize intra-operative equipment management.    Patient's  anticipated LOS is less than 2 midnights, meeting these requirements: - Younger than 4065 - Lives within 1 hour of care - Has a competent adult at home to recover with post-op recover - NO history of  - Chronic pain requiring opiods  - Diabetes  - Coronary Artery Disease  - Heart failure  - Heart attack  - Stroke  - DVT/VTE  - Cardiac arrhythmia  - Respiratory Failure/COPD  - Renal failure  - Anemia  - Advanced Liver disease       Guy SandiferColby Alan Robbins 10/09/2017, 9:28 AM

## 2017-10-09 NOTE — Anesthesia Postprocedure Evaluation (Signed)
Anesthesia Post Note  Patient: Bonnita LevanJudy M Pomeroy  Procedure(s) Performed: LEFT TOTAL KNEE ARTHROPLASTY (Left Knee)     Patient location during evaluation: PACU Anesthesia Type: Spinal and Regional Level of consciousness: oriented and awake and alert Pain management: pain level controlled Vital Signs Assessment: post-procedure vital signs reviewed and stable Respiratory status: spontaneous breathing, respiratory function stable and patient connected to nasal cannula oxygen Cardiovascular status: blood pressure returned to baseline and stable Postop Assessment: no headache, no backache, no apparent nausea or vomiting, patient able to bend at knees and spinal receding Anesthetic complications: no    Last Vitals:  Vitals:   10/09/17 1730 10/09/17 1745  BP: 140/69 116/67  Pulse: (!) 51 (!) 58  Resp: 11 13  Temp:  (!) 36.4 C  SpO2: 100% 99%    Last Pain:  Vitals:   10/09/17 1745  TempSrc:   PainSc: Asleep                 Tadan Shill,W. EDMOND

## 2017-10-09 NOTE — Transfer of Care (Signed)
Immediate Anesthesia Transfer of Care Note  Patient: Taylor LevanJudy M Peters  Procedure(s) Performed: LEFT TOTAL KNEE ARTHROPLASTY (Left Knee)  Patient Location: PACU  Anesthesia Type:Spinal  Level of Consciousness: awake, alert , oriented and patient cooperative  Airway & Oxygen Therapy: Patient Spontanous Breathing and Patient connected to face mask oxygen  Post-op Assessment: Report given to RN, Post -op Vital signs reviewed and stable and Patient moving all extremities  Post vital signs: Reviewed and stable  Last Vitals:  Vitals Value Taken Time  BP 96/72 10/09/2017  4:45 PM  Temp    Pulse 75 10/09/2017  4:49 PM  Resp 18 10/09/2017  4:49 PM  SpO2 100 % 10/09/2017  4:49 PM  Vitals shown include unvalidated device data.  Last Pain:  Vitals:   10/09/17 1325  TempSrc:   PainSc: 0-No pain         Complications: No apparent anesthesia complications

## 2017-10-10 ENCOUNTER — Encounter (HOSPITAL_COMMUNITY): Payer: Self-pay | Admitting: Orthopedic Surgery

## 2017-10-10 DIAGNOSIS — M1712 Unilateral primary osteoarthritis, left knee: Secondary | ICD-10-CM | POA: Diagnosis not present

## 2017-10-10 LAB — BASIC METABOLIC PANEL
Anion gap: 7 (ref 5–15)
BUN: 8 mg/dL (ref 8–23)
CHLORIDE: 113 mmol/L — AB (ref 98–111)
CO2: 26 mmol/L (ref 22–32)
Calcium: 8.9 mg/dL (ref 8.9–10.3)
Creatinine, Ser: 0.72 mg/dL (ref 0.44–1.00)
GFR calc Af Amer: >60 mL/min (ref >60)
GFR calc non Af Amer: >60 mL/min (ref >60)
GLUCOSE: 109 mg/dL — AB (ref 70–99)
Potassium: 4.4 mmol/L (ref 3.5–5.1)
Sodium: 146 mmol/L — ABNORMAL HIGH (ref 135–145)

## 2017-10-10 LAB — GLUCOSE, CAPILLARY
Glucose-Capillary: 94 mg/dL (ref 70–99)
Glucose-Capillary: 179 mg/dL — ABNORMAL HIGH (ref 70–99)

## 2017-10-10 LAB — CBC
HEMATOCRIT: 35.8 % — AB (ref 36.0–46.0)
Hemoglobin: 11.7 g/dL — ABNORMAL LOW (ref 12.0–15.0)
MCH: 31 pg (ref 26.0–34.0)
MCHC: 32.7 g/dL (ref 30.0–36.0)
MCV: 94.7 fL (ref 78.0–100.0)
Platelets: 210 10*3/uL (ref 150–400)
RBC: 3.78 MIL/uL — ABNORMAL LOW (ref 3.87–5.11)
RDW: 13.6 % (ref 11.5–15.5)
WBC: 12.4 10*3/uL — AB (ref 4.0–10.5)

## 2017-10-10 MED ORDER — METHOCARBAMOL 500 MG PO TABS: 500.0000 mg | ORAL_TABLET | Freq: Four times a day (QID) | ORAL | 0 refills | Status: AC | PRN

## 2017-10-10 MED ORDER — ASPIRIN 325 MG PO TBEC: 325.0000 mg | DELAYED_RELEASE_TABLET | Freq: Two times a day (BID) | ORAL | 0 refills | Status: AC

## 2017-10-10 MED ORDER — OXYCODONE HCL 5 MG PO TABS: 5.0000 mg | ORAL_TABLET | Freq: Four times a day (QID) | ORAL | 0 refills | Status: AC | PRN

## 2017-10-10 NOTE — Evaluation (Signed)
Physical Therapy Evaluation Patient Details Name: Taylor MORRISSETTE MRN: [id number] DOB: 15-Oct-1955 Today's Date: 10/10/2017   History of Present Illness  s/p L TKA  Clinical Impression  Pt is s/p TKA resulting in the deficits listed below (see PT Problem List).  Pt will benefit from skilled PT to increase their independence and safety with mobility to allow discharge to the venue listed below.      Follow Up Recommendations Follow surgeon's recommendation for DC plan and follow-up therapies    Equipment Recommendations  None recommended by PT    Recommendations for Other Services       Precautions / Restrictions Precautions Precautions: Fall;Knee Restrictions Weight Bearing Restrictions: No      Mobility  Bed Mobility Overal bed mobility: Needs Assistance Bed Mobility: Supine to Sit     Supine to sit: Min assist     General bed mobility comments: with RLE  Transfers Overall transfer level: Needs assistance Equipment used: Rolling walker (2 wheeled) Transfers: Sit to/from Stand           General transfer comment: cues for hand placement  Ambulation/Gait Ambulation/Gait assistance: Min assist Gait Distance (Feet): 80 Feet Assistive device: Rolling walker (2 wheeled) Gait Pattern/deviations: Step-to pattern;Antalgic     General Gait Details: cues for sequence and RW safety  Stairs            Wheelchair Mobility    Modified Rankin (Stroke Patients Only)       Balance                                             Pertinent Vitals/Pain Pain Assessment: 0-10 Pain Score: 4  Pain Location: L knee Pain Descriptors / Indicators: Sore;Aching;Grimacing;Guarding Pain Intervention(s): Limited activity within patient's tolerance;Monitored during session;Premedicated before session    Home Living Family/patient expects to be discharged to:: Private residence Living Arrangements: Alone Available Help at Discharge: Available 24  hours/day Type of Home: House Home Access: Stairs to enter   [employer] of Steps: 2 Home Layout: One level Home Equipment: Environmental consultant - 2 wheels;Bedside commode      Prior Function Level of Independence: Independent               Hand Dominance        Extremity/Trunk Assessment   Upper Extremity Assessment Upper Extremity Assessment: Overall WFL for tasks assessed    Lower Extremity Assessment Lower Extremity Assessment: LLE deficits/detail LLE Deficits / Details: ankle WFL; limited by post op pain and weakness, unable to tol further ROM       Communication   Communication: No difficulties  Cognition Arousal/Alertness: Awake/alert Behavior During Therapy: WFL for tasks assessed/performed Overall Cognitive Status: Within Functional Limits for tasks assessed                                        General Comments      Exercises Total Joint Exercises Ankle Circles/Pumps: AROM;Both;10 reps Quad Sets: AROM;Both;10 reps   Assessment/Plan    PT Assessment Patient needs continued PT services  PT Problem List Decreased strength;Decreased range of motion;Decreased mobility;Decreased activity tolerance;Decreased balance;Decreased knowledge of use of DME;Pain       PT Treatment Interventions Functional mobility training;Gait training;DME instruction;Therapeutic activities;Therapeutic exercise;Patient/family education;Stair training    PT Goals (  Current goals can be found in the Care Plan section)  Acute Rehab PT Goals Potential to Achieve Goals: Good    Frequency 7X/week   Barriers to discharge        Co-evaluation               AM-PAC PT "6 Clicks" Daily Activity  Outcome Measure Difficulty turning over in bed (including adjusting bedclothes, sheets and blankets)?: Unable Difficulty moving from lying on back to sitting on the side of the bed? : Unable Difficulty sitting down on and standing up from a chair with arms (e.g.,  wheelchair, bedside commode, etc,.)?: Unable Help needed moving to and from a bed to chair (including a wheelchair)?: A Little Help needed walking in hospital room?: A Little Help needed climbing 3-5 steps with a railing? : A Little 6 Click Score: 12    End of Session Equipment Utilized During Treatment: Gait belt Activity Tolerance: Patient tolerated treatment well Patient left: with call bell/phone within reach;in bed;with chair alarm set   PT Visit Diagnosis: Difficulty in walking, not elsewhere classified (R26.2)    Time: [id number] PT Time Calculation (min) (ACUTE ONLY): 23 min   Charges:   PT Evaluation $PT Eval Low Complexity: 1 Low PT Treatments $Gait Training: 8-22 mins        Drucilla Chaletara Thurza Kwiecinski, PT Pager: [phone] 10/10/2017   [facility] [phone]   [facility]ILLIAMS,Taylor Kirchhoff 10/10/2017, 11:26 AM

## 2017-10-10 NOTE — Progress Notes (Signed)
SPORTS MEDICINE AND JOINT REPLACEMENT  Georgena SpurlingStephen Lucey, MD    Laurier Nancyolby Eliora Nienhuis, PA-C [address], [place], [place]  [id number]                             [phone]   PROGRESS NOTE  Subjective:  negative for Chest Pain  negative for Shortness of Breath  negative for Nausea/Vomiting   negative for Calf Pain  negative for Bowel Movement   Tolerating Diet: yes         Patient reports pain as 4 on 0-10 scale.    Objective: Vital signs in last 24 hours:    Patient Vitals for the past 24 hrs:  BP Temp Temp src Pulse Resp SpO2 Height Weight  10/10/17 0554 121/73 97.9 F (36.6 C) Oral 63 16 100 % - -  10/10/17 0118 124/64 (!) 97.5 F (36.4 C) Oral 64 16 100 % - -  10/09/17 2118 (!) 114/94 (!) 97.5 F (36.4 C) Oral 65 16 100 % - -  10/09/17 1956 125/68 97.6 F (36.4 C) Oral 72 - 100 % - -  10/09/17 1900 130/70 98.3 F (36.8 C) Oral (!) 59 16 100 % - -  10/09/17 1801 129/73 (!) 97.5 F (36.4 C) - (!) 55 16 99 % - -  10/09/17 1745 116/67 (!) 97.5 F (36.4 C) - (!) 58 13 99 % - -  10/09/17 1730 140/69 - - (!) 51 11 100 % - -  10/09/17 1715 131/70 - - (!) 51 14 100 % - -  10/09/17 1700 (!) 128/59 - - (!) 52 15 100 % - -  10/09/17 1645 96/72 (!) 97.5 F (36.4 C) - (!) 52 14 100 % - -  10/09/17 1330 106/69 - - (!) 56 13 98 % - -  10/09/17 1327 - - - (!) 53 12 99 % - -  10/09/17 1326 106/60 - - (!) 56 13 99 % - -  10/09/17 1325 - - - (!) 56 14 98 % - -  10/09/17 1324 - - - (!) 54 13 98 % - -  10/09/17 1323 - - - (!) 55 13 97 % - -  10/09/17 1322 - - - (!) 54 12 97 % - -  10/09/17 1321 - - - (!) 54 13 98 % - -  10/09/17 1320 108/68 - - (!) 54 14 98 % - -  10/09/17 1319 - - - (!) 51 12 96 % - -  10/09/17 1318 - - - (!) 56 14 97 % - -  10/09/17 1317 - - - (!) 56 14 98 % - -  10/09/17 1316 - - - (!) 55 14 99 % - -  10/09/17 1315 111/71 - - (!) 58 15 100 % - -  10/09/17 1314 - - - (!) 58 19 99 % - -  10/09/17 1313 - - - 61 15 97 % - -  10/09/17 1312 - - - (!) 57 13 100 % -  -  10/09/17 1311 124/68 - - (!) 47 13 100 % - -  10/09/17 1310 - - - (!) 50 11 100 % - -  10/09/17 1108 - - - - - - 5\' 8"  (1.727 m) 78 kg  10/09/17 1016 109/68 98.1 F (36.7 C) Oral (!) 58 16 98 % - -    @flow {1959:LAST@   Intake/Output from previous day:   09/16 0701 - 09/17  0700 In: 3171.4 [P.O.:300; I.V.:2431.4] Out: 2525 [Urine:2475]   Intake/Output this shift:   No intake/output data recorded.   Intake/Output      09/16 0701 - 09/17 0700 09/17 0701 - 09/18 0700   P.O. 300    I.V. (mL/kg) 2431.4 (31.2)    IV Piggyback 440    Total Intake(mL/kg) 3171.4 (40.7)    Urine (mL/kg/hr) 2475    Blood 50    Total Output 2525    Net +646.4            LABORATORY DATA: Recent Labs    10/05/17 1202 10/10/17 0518  WBC 9.2 12.4*  HGB 14.4 11.7*  HCT 42.6 35.8*  PLT 273 210   Recent Labs    10/05/17 1202 10/10/17 0518  NA 141 146*  K 4.8 4.4  CL 108 113*  CO2 24 26  BUN 12 8  CREATININE 0.71 0.72  GLUCOSE 93 109*  CALCIUM 9.8 8.9   Lab Results  Component Value Date   INR 1.01 05/10/2013   INR 1.01 08/31/2010   INR 2.45 (H) 02/08/2010    Examination:  General appearance: alert, cooperative and no distress Extremities: extremities normal, atraumatic, no cyanosis or edema  Wound Exam: clean, dry, intact   Drainage:  None: wound tissue dry  Motor Exam: Quadriceps and Hamstrings Intact  Sensory Exam: Superficial Peroneal, Deep Peroneal and Tibial normal   Assessment:    1 Day Post-Op  Procedure(s) (LRB): LEFT TOTAL KNEE ARTHROPLASTY (Left)  ADDITIONAL DIAGNOSIS:  Active Problems:   S/P total knee replacement     Plan: Physical Therapy as ordered Weight Bearing as Tolerated (WBAT)  DVT Prophylaxis:  Aspirin  DISCHARGE PLAN: Home  DISCHARGE NEEDS: HHPT     Patient doing well, anticipate D/C home today  Patient's anticipated LOS is less than 2 midnights, meeting these requirements: - Younger than 68 - Lives within 1 hour of care - Has a  competent adult at home to recover with post-op recover - NO history of  - Chronic pain requiring opiods  - Diabetes  - Coronary Artery Disease  - Heart failure  - Heart attack  - Stroke  - DVT/VTE  - Cardiac arrhythmia  - Respiratory Failure/COPD  - Renal failure  - Anemia  - Advanced Liver disease        Guy Sandifer 10/10/2017, 7:14 AM

## 2017-10-10 NOTE — Discharge Summary (Signed)
SPORTS MEDICINE & JOINT REPLACEMENT   Georgena Spurling, MD   Laurier Nancy, PA-C [address] [place], [place], [place]  [address]                             [phone]  PATIENT ID: [name]        MRN:  [id number]          DOB/AGE: January 03, 1956 / 62 y.o.    DISCHARGE SUMMARY  ADMISSION DATE:    10/09/2017 DISCHARGE DATE:   10/10/2017   ADMISSION DIAGNOSIS: LT.Knee  Osteoarthritis    DISCHARGE DIAGNOSIS:  LT.Knee  Osteoarthritis    ADDITIONAL DIAGNOSIS: Active Problems:   S/P total knee replacement  Past Medical History:  Diagnosis Date  . Anemia   . Anxiety   . Arthritis   . Chronic back pain   . Complication of anesthesia    slow to wake up after one of her surgeries, blood pressure dropped with one surgery 3 yrs ago  . Constipation    takes OTC stool softener  . Depression    takes Celexa daily  . Diabetes mellitus without complication (HCC)    type 2  . Diverticulosis   . Fibromyalgia   . GERD (gastroesophageal reflux disease)    takes Protonix daily  . History of colon polyps   . History of migraine    takes Topamax nightly;aurora  . Hyperlipidemia    takes Crestor daily  . Hypertension    hx of and has been off meds for yrs--lost weight  . Hypothyroidism    small thryoid nodule checked every few years  . Joint pain   . Muscle spasm    takes Zanaflex daily  . Nasal congestion    uses Dymista daily as needed  . Yeast infection    was on antibiotic and going to pick up script for this    PROCEDURE: Procedure(s): LEFT TOTAL KNEE ARTHROPLASTY on 10/09/2017  CONSULTS:    HISTORY:  See H&P in chart  HOSPITAL COURSE:  ALA KRATZ is a 62 y.o. admitted on 10/09/2017 and found to have a diagnosis of LT.Knee  Osteoarthritis.  After appropriate laboratory studies were obtained  they were taken to the operating room on 10/09/2017 and underwent Procedure(s): LEFT TOTAL KNEE ARTHROPLASTY.   They were given perioperative antibiotics:  Anti-infectives  (From admission, onward)   Start     Dose/Rate Route Frequency Ordered Stop   10/09/17 2100  ceFAZolin (ANCEF) IVPB 2g/100 mL premix     2 g 200 mL/hr over 30 Minutes Intravenous Every 6 hours 10/09/17 1809 10/10/17 0425   10/09/17 1015  ceFAZolin (ANCEF) IVPB 2g/100 mL premix     2 g 200 mL/hr over 30 Minutes Intravenous On call to O.R. 10/09/17 1012 10/09/17 1502    .  Patient given tranexamic acid IV or topical and exparel intra-operatively.  Tolerated the procedure well.    POD# 1: Vital signs were stable.  Patient denied Chest pain, shortness of breath, or calf pain.  Patient was started on Aspirin twice daily at 8am.  Consults to PT, OT, and care management were made.  The patient was weight bearing as tolerated.  CPM was placed on the operative leg 0-90 degrees for 6-8 hours a day. When out of the CPM, patient was placed in the foam block to achieve full extension. Incentive spirometry was taught.  Dressing was changed.  POD #2, Continued  PT for ambulation and exercise program.  IV saline locked.  O2 discontinued.    The remainder of the hospital course was dedicated to ambulation and strengthening.   The patient was discharged on 1 Day Post-Op in  Good condition.  Blood products given:none  DIAGNOSTIC STUDIES: Recent vital signs:  Patient Vitals for the past 24 hrs:  BP Temp Temp src Pulse Resp SpO2  10/10/17 0928 135/70 97.8 F (36.6 C) Oral 69 16 100 %  10/10/17 0554 121/73 97.9 F (36.6 C) Oral 63 16 100 %  10/10/17 0118 124/64 (!) 97.5 F (36.4 C) Oral 64 16 100 %  10/09/17 2118 (!) 114/94 (!) 97.5 F (36.4 C) Oral 65 16 100 %  10/09/17 1956 125/68 97.6 F (36.4 C) Oral 72 - 100 %  10/09/17 1900 130/70 98.3 F (36.8 C) Oral (!) 59 16 100 %  10/09/17 1801 129/73 (!) 97.5 F (36.4 C) - (!) 55 16 99 %  10/09/17 1745 116/67 (!) 97.5 F (36.4 C) - (!) 58 13 99 %  10/09/17 1730 140/69 - - (!) 51 11 100 %  10/09/17 1715 131/70 - - (!) 51 14 100 %  10/09/17  1700 (!) 128/59 - - (!) 52 15 100 %  10/09/17 1645 96/72 (!) 97.5 F (36.4 C) - (!) 52 14 100 %  10/09/17 1330 106/69 - - (!) 56 13 98 %  10/09/17 1327 - - - (!) 53 12 99 %  10/09/17 1326 106/60 - - (!) 56 13 99 %  10/09/17 1325 - - - (!) 56 14 98 %  10/09/17 1324 - - - (!) 54 13 98 %  10/09/17 1323 - - - (!) 55 13 97 %  10/09/17 1322 - - - (!) 54 12 97 %  10/09/17 1321 - - - (!) 54 13 98 %  10/09/17 1320 108/68 - - (!) 54 14 98 %  10/09/17 1319 - - - (!) 51 12 96 %  10/09/17 1318 - - - (!) 56 14 97 %  10/09/17 1317 - - - (!) 56 14 98 %  10/09/17 1316 - - - (!) 55 14 99 %  10/09/17 1315 111/71 - - (!) 58 15 100 %  10/09/17 1314 - - - (!) 58 19 99 %  10/09/17 1313 - - - 61 15 97 %  10/09/17 1312 - - - (!) 57 13 100 %  10/09/17 1311 124/68 - - (!) 47 13 100 %  10/09/17 1310 - - - (!) 50 11 100 %       Recent laboratory studies: Recent Labs    10/05/17 1202 10/10/17 0518  WBC 9.2 12.4*  HGB 14.4 11.7*  HCT 42.6 35.8*  PLT 273 210   Recent Labs    10/05/17 1202 10/10/17 0518  NA 141 146*  K 4.8 4.4  CL 108 113*  CO2 24 26  BUN 12 8  CREATININE 0.71 0.72  GLUCOSE 93 109*  CALCIUM 9.8 8.9   Lab Results  Component Value Date   INR 1.01 05/10/2013   INR 1.01 08/31/2010   INR 2.45 (H) 02/08/2010     Recent Radiographic Studies :  No results found.  DISCHARGE INSTRUCTIONS: Discharge Instructions    CPM   Complete by:  As directed    Continuous passive motion machine (CPM):      Use the CPM from 0 to 90 for 4-6 hours per day.  You may increase by 10 per day.  You may break it up into 2 or 3 sessions per day.      Use CPM for 2 weeks or until you are told to stop.   Call MD / Call 911   Complete by:  As directed    If you experience chest pain or shortness of breath, CALL 911 and be transported to the hospital emergency room.  If you develope a fever above 101 F, pus (white drainage) or increased drainage or redness at the wound, or calf pain, call your  surgeon's office.   Constipation Prevention   Complete by:  As directed    Drink plenty of fluids.  Prune juice may be helpful.  You may use a stool softener, such as Colace (over the counter) 100 mg twice a day.  Use MiraLax (over the counter) for constipation as needed.   Diet - low sodium heart healthy   Complete by:  As directed    Discharge instructions   Complete by:  As directed    INSTRUCTIONS AFTER JOINT REPLACEMENT   Remove items at home which could result in a fall. This includes throw rugs or furniture in walking pathways ICE to the affected joint every three hours while awake for 30 minutes at a time, for at least the first 3-5 days, and then as needed for pain and swelling.  Continue to use ice for pain and swelling. You may notice swelling that will progress down to the foot and ankle.  This is normal after surgery.  Elevate your leg when you are not up walking on it.   Continue to use the breathing machine you got in the hospital (incentive spirometer) which will help keep your temperature down.  It is common for your temperature to cycle up and down following surgery, especially at night when you are not up moving around and exerting yourself.  The breathing machine keeps your lungs expanded and your temperature down.   DIET:  As you were doing prior to hospitalization, we recommend a well-balanced diet.  DRESSING / WOUND CARE / SHOWERING  Keep the surgical dressing until follow up.  The dressing is water proof, so you can shower without any extra covering.  IF THE DRESSING FALLS OFF or the wound gets wet inside, change the dressing with sterile gauze.  Please use good hand washing techniques before changing the dressing.  Do not use any lotions or creams on the incision until instructed by your surgeon.    ACTIVITY  Increase activity slowly as tolerated, but follow the weight bearing instructions below.   No driving for 6 weeks or until further direction given by your  physician.  You cannot drive while taking narcotics.  No lifting or carrying greater than 10 lbs. until further directed by your surgeon. Avoid periods of inactivity such as sitting longer than an hour when not asleep. This helps prevent blood clots.  You may return to work once you are authorized by your doctor.     WEIGHT BEARING   Weight bearing as tolerated with assist device (walker, cane, etc) as directed, use it as long as suggested by your surgeon or therapist, typically at least 4-6 weeks.   EXERCISES  Results after joint replacement surgery are often greatly improved when you follow the exercise, range of motion and muscle strengthening exercises prescribed by your doctor. Safety measures are also important to protect the joint from further injury. Any time any of these exercises  cause you to have increased pain or swelling, decrease what you are doing until you are comfortable again and then slowly increase them. If you have problems or questions, call your caregiver or physical therapist for advice.   Rehabilitation is important following a joint replacement. After just a few days of immobilization, the muscles of the leg can become weakened and shrink (atrophy).  These exercises are designed to build up the tone and strength of the thigh and leg muscles and to improve motion. Often times heat used for twenty to thirty minutes before working out will loosen up your tissues and help with improving the range of motion but do not use heat for the first two weeks following surgery (sometimes heat can increase post-operative swelling).   These exercises can be done on a training (exercise) mat, on the floor, on a table or on a bed. Use whatever works the best and is most comfortable for you.    Use music or television while you are exercising so that the exercises are a pleasant break in your day. This will make your life better with the exercises acting as a break in your routine that you  can look forward to.   Perform all exercises about fifteen times, three times per day or as directed.  You should exercise both the operative leg and the other leg as well.   Exercises include:   Quad Sets - Tighten up the muscle on the front of the thigh (Quad) and hold for 5-10 seconds.   Straight Leg Raises - With your knee straight (if you were given a brace, keep it on), lift the leg to 60 degrees, hold for 3 seconds, and slowly lower the leg.  Perform this exercise against resistance later as your leg gets stronger.  Leg Slides: Lying on your back, slowly slide your foot toward your buttocks, bending your knee up off the floor (only go as far as is comfortable). Then slowly slide your foot back down until your leg is flat on the floor again.  Angel Wings: Lying on your back spread your legs to the side as far apart as you can without causing discomfort.  Hamstring Strength:  Lying on your back, push your heel against the floor with your leg straight by tightening up the muscles of your buttocks.  Repeat, but this time bend your knee to a comfortable angle, and push your heel against the floor.  You may put a pillow under the heel to make it more comfortable if necessary.   A rehabilitation program following joint replacement surgery can speed recovery and prevent re-injury in the future due to weakened muscles. Contact your doctor or a physical therapist for more information on knee rehabilitation.    CONSTIPATION  Constipation is defined medically as fewer than three stools per week and severe constipation as less than one stool per week.  Even if you have a regular bowel pattern at home, your normal regimen is likely to be disrupted due to multiple reasons following surgery.  Combination of anesthesia, postoperative narcotics, change in appetite and fluid intake all can affect your bowels.   YOU MUST use at least one of the following options; they are listed in order of increasing strength  to get the job done.  They are all available over the counter, and you may need to use some, POSSIBLY even all of these options:    Drink plenty of fluids (prune juice may be helpful) and high fiber foods  Colace 100 mg by mouth twice a day  Senokot for constipation as directed and as needed Dulcolax (bisacodyl), take with full glass of water  Miralax (polyethylene glycol) once or twice a day as needed.  If you have tried all these things and are unable to have a bowel movement in the first 3-4 days after surgery call either your surgeon or your primary doctor.    If you experience loose stools or diarrhea, hold the medications until you stool forms back up.  If your symptoms do not get better within 1 week or if they get worse, check with your doctor.  If you experience "the worst abdominal pain ever" or develop nausea or vomiting, please contact the office immediately for further recommendations for treatment.   ITCHING:  If you experience itching with your medications, try taking only a single pain pill, or even half a pain pill at a time.  You can also use Benadryl over the counter for itching or also to help with sleep.   TED HOSE STOCKINGS:  Use stockings on both legs until for at least 2 weeks or as directed by physician office. They may be removed at night for sleeping.  MEDICATIONS:  See your medication summary on the "After Visit Summary" that nursing will review with you.  You may have some home medications which will be placed on hold until you complete the course of blood thinner medication.  It is important for you to complete the blood thinner medication as prescribed.  PRECAUTIONS:  If you experience chest pain or shortness of breath - call 911 immediately for transfer to the hospital emergency department.   If you develop a fever greater that 101 F, purulent drainage from wound, increased redness or drainage from wound, foul odor from the wound/dressing, or calf pain - CONTACT  YOUR SURGEON.                                                   FOLLOW-UP APPOINTMENTS:  If you do not already have a post-op appointment, please call the office for an appointment to be seen by your surgeon.  Guidelines for how soon to be seen are listed in your "After Visit Summary", but are typically between 1-4 weeks after surgery.  OTHER INSTRUCTIONS:   Knee Replacement:  Do not place pillow under knee, focus on keeping the knee straight while resting. CPM instructions: 0-90 degrees, 2 hours in the morning, 2 hours in the afternoon, and 2 hours in the evening. Place foam block, curve side up under heel at all times except when in CPM or when walking.  DO NOT modify, tear, cut, or change the foam block in any way.  MAKE SURE YOU:  Understand these instructions.  Get help right away if you are not doing well or get worse.    Thank you for letting us be a part of your medical care team.  It is a privilege we respect greatly.  We hope these instructions will help you stay on track for a fast and full recovery!   Increase activity slowly as tolerated   Complete by:  As directed       DISCHARGE MEDICATIONS:   Allergies as of 10/10/2017      Reactions   Flexeril [cyclobenzaprine] Hives   Metronidazole Hives      Medication  List    STOP taking these medications   tiZANidine 4 MG tablet Commonly known as:  ZANAFLEX     TAKE these medications   aspirin 325 MG EC tablet Take 1 tablet (325 mg total) by mouth 2 (two) times daily.   busPIRone 10 MG tablet Commonly known as:  BUSPAR Take 10 mg by mouth 2 (two) times daily.   DYMISTA 137-50 MCG/ACT Susp Generic drug:  Azelastine-Fluticasone Place 1 spray into the nose daily as needed (congestion).   gabapentin 600 MG tablet Commonly known as:  NEURONTIN Take 300 mg by mouth 2 (two) times daily.   levothyroxine 50 MCG tablet Commonly known as:  SYNTHROID, LEVOTHROID Take 50 mcg by mouth daily before breakfast.   metFORMIN 500  MG tablet Commonly known as:  GLUCOPHAGE Take 500 mg by mouth 2 (two) times daily with a meal.   methocarbamol 500 MG tablet Commonly known as:  ROBAXIN Take 1-2 tablets (500-1,000 mg total) by mouth every 6 (six) hours as needed for muscle spasms.   multivitamin with minerals tablet Take 1 tablet by mouth daily.   oxyCODONE 5 MG immediate release tablet Commonly known as:  Oxy IR/ROXICODONE Take 1-2 tablets (5-10 mg total) by mouth every 6 (six) hours as needed for moderate pain (pain score 4-6).   pantoprazole 20 MG tablet Commonly known as:  PROTONIX Take 20 mg by mouth daily.   rosuvastatin 10 MG tablet Commonly known as:  CRESTOR Take 10 mg by mouth at bedtime.   topiramate 50 MG tablet Commonly known as:  TOPAMAX Take 50 mg by mouth 2 (two) times daily.   TRINTELLIX 10 MG Tabs tablet Generic drug:  vortioxetine HBr Take 10 mg by mouth daily.            Durable Medical Equipment  (From admission, onward)         Start     Ordered   10/09/17 1809  DME Walker rolling  Once    Question:  Patient needs a walker to treat with the following condition  Answer:  S/P total knee replacement   10/09/17 1809   10/09/17 1809  DME 3 n 1  Once     10/09/17 1809   10/09/17 1809  DME Bedside commode  Once    Question:  Patient needs a bedside commode to treat with the following condition  Answer:  S/P total knee replacement   10/09/17 1809          FOLLOW UP VISIT:   Follow-up Information    [facility].   Specialty:  Home Health Services Why:  physical therapy Contact information: [address] [place] [address] [phone]           DISPOSITION: HOME VS. SNF  CONDITION:  Good   Guy Sandifer 10/10/2017, 12:56 PM

## 2017-10-10 NOTE — Addendum Note (Signed)
Addendum  created 10/10/17 [id number] by Elyn PeersAllen, Diontae Route J, CRNA   Charge Capture section accepted

## 2017-10-10 NOTE — Progress Notes (Signed)
Physical Therapy Treatment Patient Details Name: [name] MRN: [id number] DOB: 12/02/1955 Today's Date: 10/10/2017    History of Present Illness s/p L TKA    PT Comments    Pt progressing well; feels ready for d/c home today; completed stairs, amb and HEP  Follow Up Recommendations  Follow surgeon's recommendation for DC plan and follow-up therapies     Equipment Recommendations  None recommended by PT    Recommendations for Other Services       Precautions / Restrictions Precautions Precautions: Fall;Knee Restrictions Weight Bearing Restrictions: No    Mobility  Bed Mobility Overal bed mobility: Needs Assistance Bed Mobility: Supine to Sit;Sit to Supine     Supine to sit: Min guard Sit to supine: Min guard   General bed mobility comments: with LLE  Transfers Overall transfer level: Needs assistance Equipment used: Rolling walker (2 wheeled) Transfers: Sit to/from Stand Sit to Stand: Min guard         General transfer comment: cues for hand placement  Ambulation/Gait Ambulation/Gait assistance: Min guard Gait Distance (Feet): 30 Feet Assistive device: Rolling walker (2 wheeled) Gait Pattern/deviations: Step-to pattern;Antalgic     General Gait Details: cues for sequence and RW safety   Stairs   Stairs assistance: Min guard;Min assist Stair Management: Step to pattern;Backwards;With walker   General stair comments: cues for sequence and safety   Wheelchair Mobility    Modified Rankin (Stroke Patients Only)       Balance                                            Cognition Arousal/Alertness: Awake/alert Behavior During Therapy: WFL for tasks assessed/performed Overall Cognitive Status: Within Functional Limits for tasks assessed                                        Exercises Total Joint Exercises Ankle Circles/Pumps: AROM;Both;10 reps Quad Sets: AROM;Both;10 reps Heel Slides:  AAROM;Left;5 reps Hip ABduction/ADduction: AROM;AAROM;Left;10 reps Straight Leg Raises: AROM;Left;10 reps    General Comments        Pertinent Vitals/Pain Pain Location: L knee Pain Descriptors / Indicators: Sore;Aching;Grimacing;Guarding    Home Living                      Prior Function            PT Goals (current goals can now be found in the care plan section) Acute Rehab PT Goals Potential to Achieve Goals: Good    Frequency    7X/week      PT Plan Current plan remains appropriate    Co-evaluation              AM-PAC PT "6 Clicks" Daily Activity  Outcome Measure  Difficulty turning over in bed (including adjusting bedclothes, sheets and blankets)?: Unable Difficulty moving from lying on back to sitting on the side of the bed? : Unable Difficulty sitting down on and standing up from a chair with arms (e.g., wheelchair, bedside commode, etc,.)?: A Little Help needed moving to and from a bed to chair (including a wheelchair)?: A Little Help needed walking in hospital room?: A Little Help needed climbing 3-5 steps with a railing? : A Little 6 Click Score: 14    End  of Session Equipment Utilized During Treatment: Gait belt Activity Tolerance: Patient tolerated treatment well Patient left: in bed;with call bell/phone within reach;with family/visitor present   PT Visit Diagnosis: Difficulty in walking, not elsewhere classified (R26.2)     Time:  -     Charges:                           [facility] 10/10/2017, 10:56 PM

## 2017-10-10 NOTE — Care Management Note (Signed)
Case Management Note  Patient Details  Name: [name] MRN: [id number] Date of Birth: [birth year]  Subjective/Objective:        Discharge planning, spoke with patient and spouse at bedside. [facility].  Action/Plan: [facility] Has DME. [phone]            Expected Discharge Date:  10/11/17               Expected Discharge Plan:  Home w Home Health Services  In-House Referral:  NA  Discharge planning Services  CM Consult  Post Acute Care Choice:  NA Choice offered to:  Patient, Spouse  DME Arranged:  N/A DME Agency:  NA  HH Arranged:  PT HH Agency:  [facility] ([facility]  Status of Service:  Completed, signed off  If discussed at [employer] Meetings, dates discussed:    Additional Comments:  Alexis Goodelleele, Troy Hartzog K, RN 10/10/2017, 11:59 AM

## 2017-10-11 NOTE — Op Note (Signed)
TOTAL KNEE REPLACEMENT OPERATIVE NOTE:  10/09/2017  9:25 PM  PATIENT:  [name]  62 y.o. female  PRE-OPERATIVE DIAGNOSIS:  LT.Knee  Osteoarthritis  POST-OPERATIVE DIAGNOSIS:  LT.Knee  Osteoarthritis  PROCEDURE:  Procedure(s): LEFT TOTAL KNEE ARTHROPLASTY  SURGEON:  Surgeon(s): Dannielle HuhLucey, Kimi Kroft, MD  PHYSICIAN ASSISTANT: Laurier Nancyolby Robbins, PA-C  ANESTHESIA:   spinal  SPECIMEN: None  COUNTS:  Correct  TOURNIQUET:   Total Tourniquet Time Documented: Thigh (Left) - 40 minutes Total: Thigh (Left) - 40 minutes   DICTATION:  Indication for procedure:    The patient is a 62 y.o. female who has failed conservative treatment for LT.Knee  Osteoarthritis.  Informed consent was obtained prior to anesthesia. The risks versus benefits of the operation were explain and in a way the patient can, and did, understand.   On the implant demand matching protocol, this patient scored 8.  Therefore, this patient was not receive a polyethylene insert with vitamin E which is a high demand implant.  Description of procedure:     The patient was taken to the operating room and placed under anesthesia.  The patient was positioned in the usual fashion taking care that all body parts were adequately padded and/or protected.  A tourniquet was applied and the leg prepped and draped in the usual sterile fashion.  The extremity was exsanguinated with the esmarch and tourniquet inflated to 350 mmHg.  Pre-operative range of motion was normal.  The knee was in 5 degree of mild varus.  A midline incision approximately 6-7 inches long was made with a #10 blade.  A new blade was used to make a parapatellar arthrotomy going 2-3 cm into the quadriceps tendon, over the patella, and alongside the medial aspect of the patellar tendon.  A synovectomy was then performed with the #10 blade and forceps. I then elevated the deep MCL off the medial tibial metaphysis subperiosteally around to the semimembranosus attachment.     I everted the patella and used calipers to measure patellar thickness.  I used the reamer to ream down to appropriate thickness to recreate the native thickness.  I then removed excess bone with the rongeur and sagittal saw.  I used the appropriately sized template and drilled the three lug holes.  I then put the trial in place and measured the thickness with the calipers to ensure recreation of the native thickness.  The trial was then removed and the patella subluxed and the knee brought into flexion.  A homan retractor was place to retract and protect the patella and lateral structures.  A Z-retractor was place medially to protect the medial structures.  The extra-medullary alignment system was used to make cut the tibial articular surface perpendicular to the anamotic axis of the tibia and in 3 degrees of posterior slope.  The cut surface and alignment jig was removed.  I then used the intramedullary alignment guide to make a 3 valgus cut on the distal femur.  I then marked out the epicondylar axis on the distal femur.  The posterior condylar axis measured 3 degrees.  I then used the anterior referencing sizer and measured the femur to be a size 8.  The 4-In-1 cutting block was screwed into place in external rotation matching the posterior condylar angle, making our cuts perpendicular to the epicondylar axis.  Anterior, posterior and chamfer cuts were made with the sagittal saw.  The cutting block and cut pieces were removed.  A lamina spreader was placed in 90 degrees of flexion.  The ACL, PCL, menisci, and posterior condylar osteophytes were removed.  A 10 mm spacer blocked was found to offer good flexion and extension gap balance after minimal in degree releasing.   The scoop retractor was then placed and the femoral finishing block was pinned in place.  The small sagittal saw was used as well as the lug drill to finish the femur.  The block and cut surfaces were removed and the medullary canal hole  filled with autograft bone from the cut pieces.  The tibia was delivered forward in deep flexion and external rotation.  A size E tray was selected and pinned into place centered on the medial 1/3 of the tibial tubercle.  The reamer and keel was used to prepare the tibia through the tray.    I then trialed with the size 8 femur, size E tibia, a 10 mm insert and the 32 patella.  I had excellent flexion/extension gap balance, excellent patella tracking.  Flexion was full and beyond 120 degrees; extension was zero.  These components were chosen and the staff opened them to me on the back table while the knee was lavaged copiously and the cement mixed.  The soft tissue was infiltrated with 60cc of exparel 1.3% through a 21 gauge needle.  I cemented in the components and removed all excess cement.  The polyethylene tibial component was snapped into place and the knee placed in extension while cement was hardening.  The capsule was infilltrated with a 60cc exparel/marcaine/saline mixture.   Once the cement was hard, the tourniquet was let down.  Hemostasis was obtained.  The arthrotomy was closed using a #1 stratofix running suture.  The deep soft tissues were closed with #0 vicryls and the subcuticular layer closed with #2-0 vicryl.  The skin was reapproximated and closed with 3.0 Monocryl.  The wound was covered with steristrips, aquacel dressing, and a TED stocking.   The patient was then awakened, extubated, and taken to the recovery room in stable condition.  BLOOD LOSS:  300cc COMPLICATIONS:  None.  PLAN OF CARE: Admit for overnight observation  PATIENT DISPOSITION:  PACU - hemodynamically stable.   Delay start of Pharmacological VTE agent (>24hrs) due to surgical blood loss or risk of bleeding:  not applicable  Please fax a copy of this op note to my office at [phone] (please only include page 1 and 2 of the Case Information op note)

## 2018-08-01 ENCOUNTER — Other Ambulatory Visit: Payer: Self-pay | Admitting: Obstetrics and Gynecology

## 2018-08-01 DIAGNOSIS — Z1231 Encounter for screening mammogram for malignant neoplasm of breast: Secondary | ICD-10-CM

## 2019-10-11 ENCOUNTER — Other Ambulatory Visit: Payer: Self-pay | Admitting: Family Medicine

## 2019-10-11 DIAGNOSIS — Z1231 Encounter for screening mammogram for malignant neoplasm of breast: Secondary | ICD-10-CM

## 2019-10-15 ENCOUNTER — Other Ambulatory Visit: Payer: Self-pay

## 2019-10-15 ENCOUNTER — Ambulatory Visit: Payer: Commercial Managed Care - PPO

## 2019-10-15 ENCOUNTER — Ambulatory Visit
Admission: RE | Admit: 2019-10-15 | Discharge: 2019-10-15 | Disposition: A | Discharge status: Home / Self Care | Payer: Self-pay | Source: Ambulatory Visit | Attending: Family Medicine | Admitting: Family Medicine

## 2019-10-15 DIAGNOSIS — Z1231 Encounter for screening mammogram for malignant neoplasm of breast: Secondary | ICD-10-CM

## 2019-11-06 ENCOUNTER — Ambulatory Visit: Payer: Commercial Managed Care - PPO

## 2022-03-16 IMAGING — MG DIGITAL SCREENING BILAT W/ TOMO W/ CAD
8 series · 8 of 24 positions shown · non-contrast
Comparison: Previous exam(s).

CLINICAL DATA: Screening.

EXAM:
DIGITAL SCREENING BILATERAL MAMMOGRAM WITH TOMO AND CAD

[L MLO synth-2D]
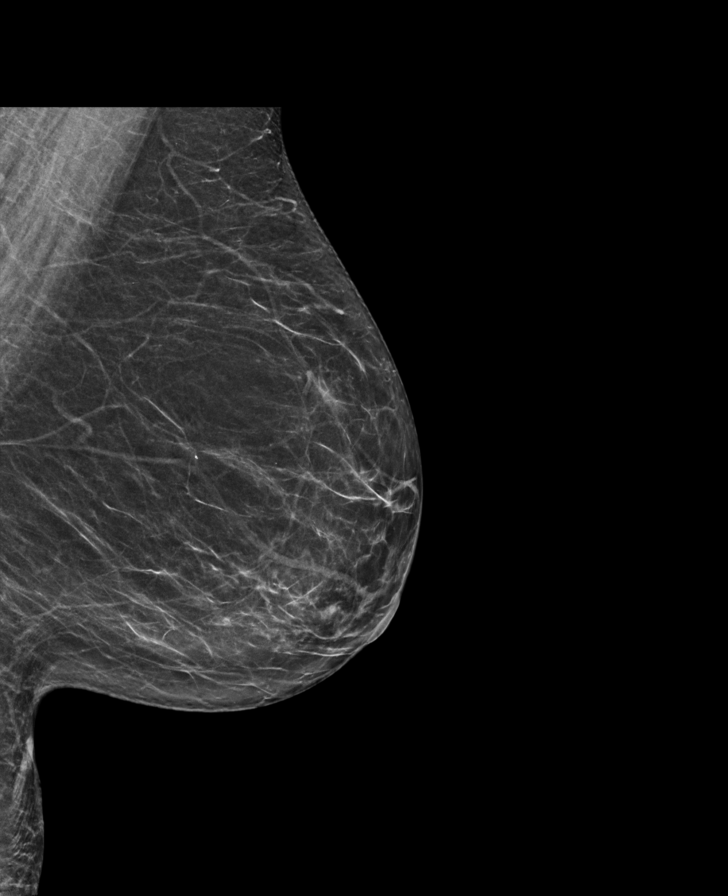

[L CC synth-2D]
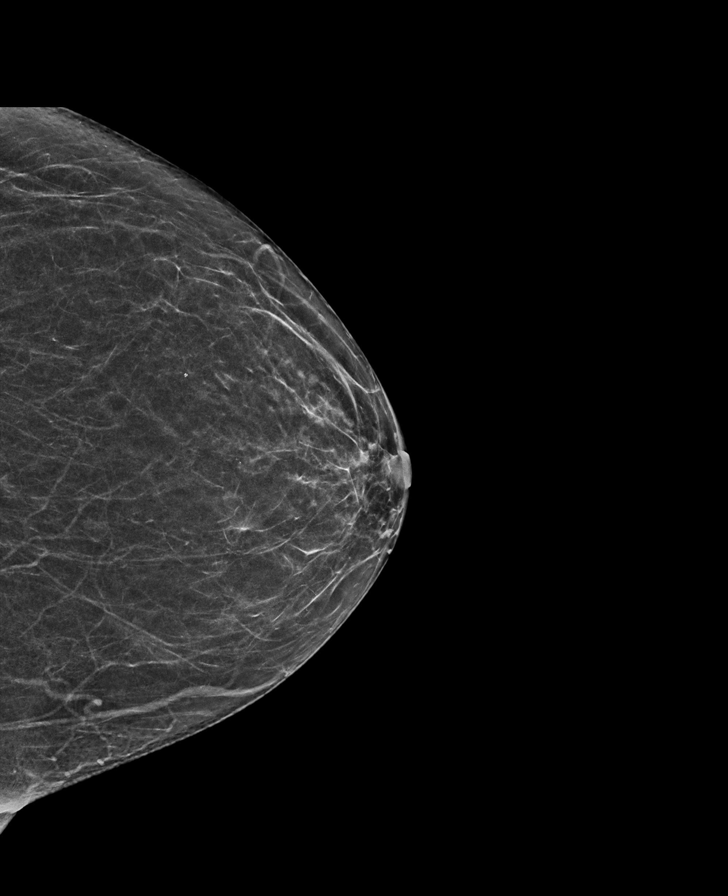

[R CC synth-2D]
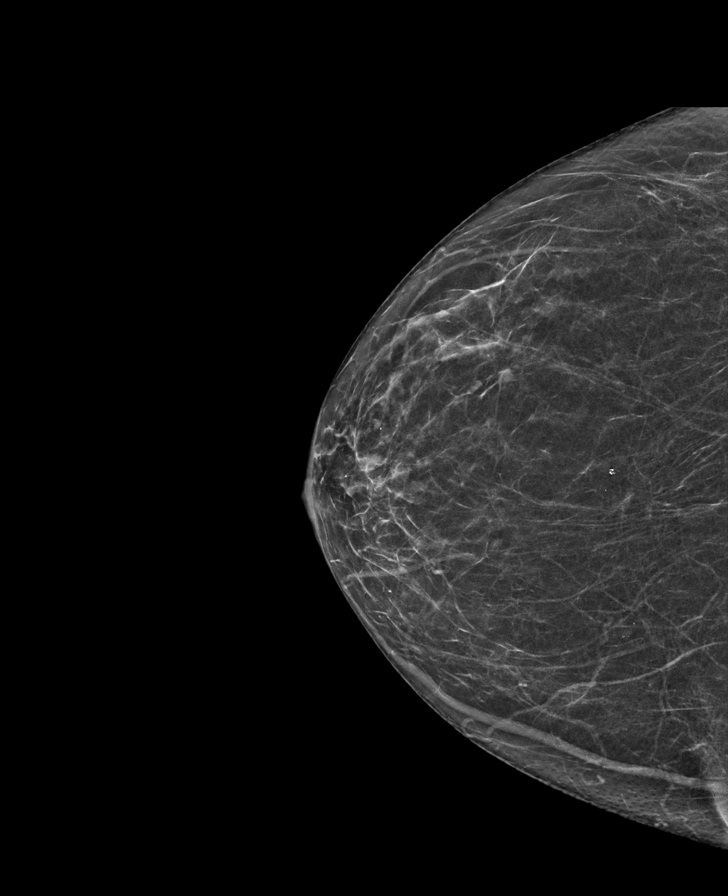

[R MLO synth-2D]
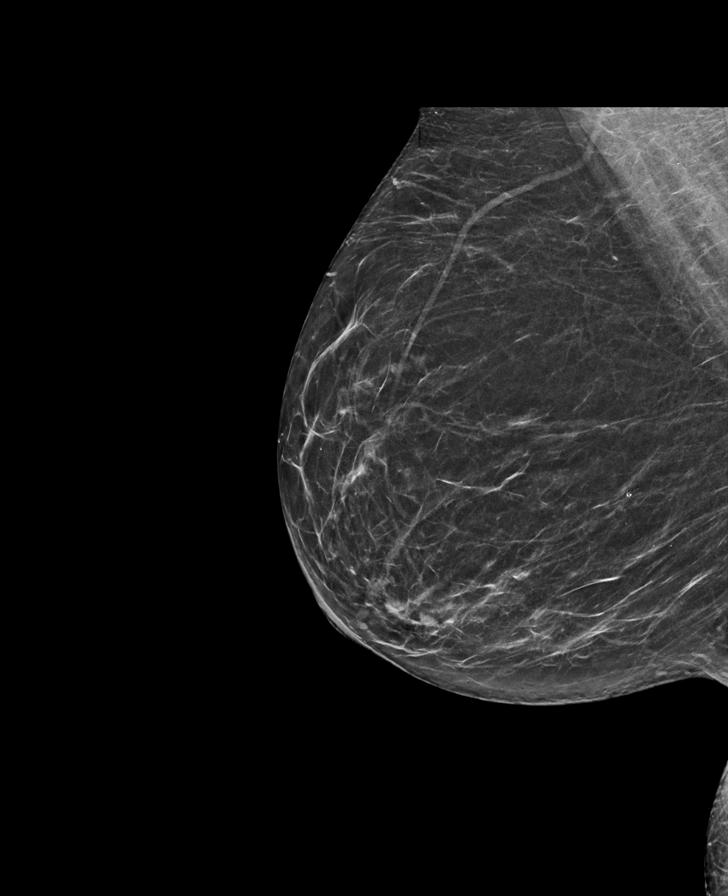

[L MLO tomo · tomo slice 33/66.0]
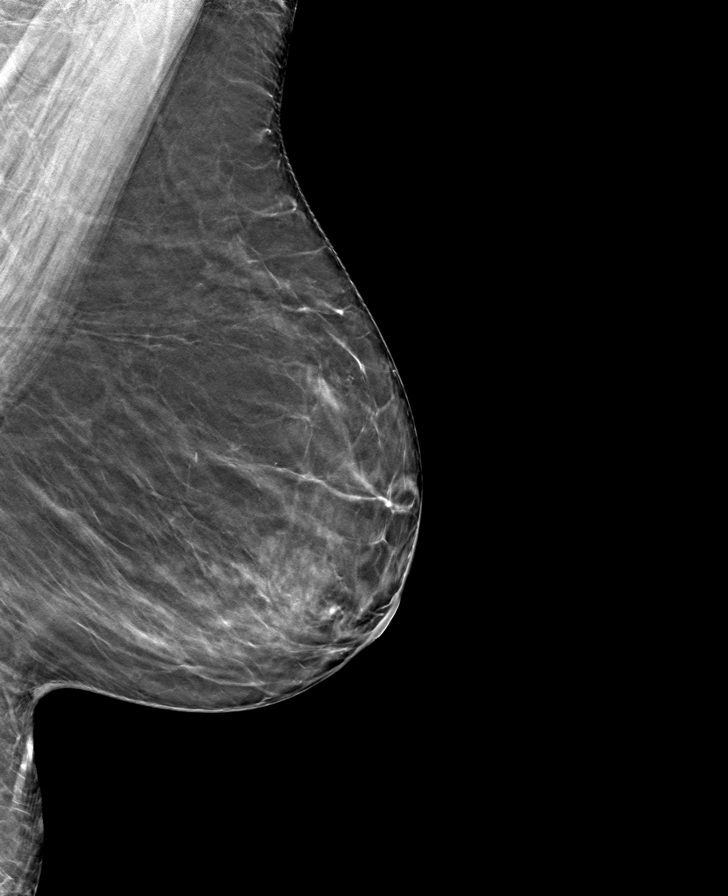

[L CC tomo · tomo slice 29/58.0]
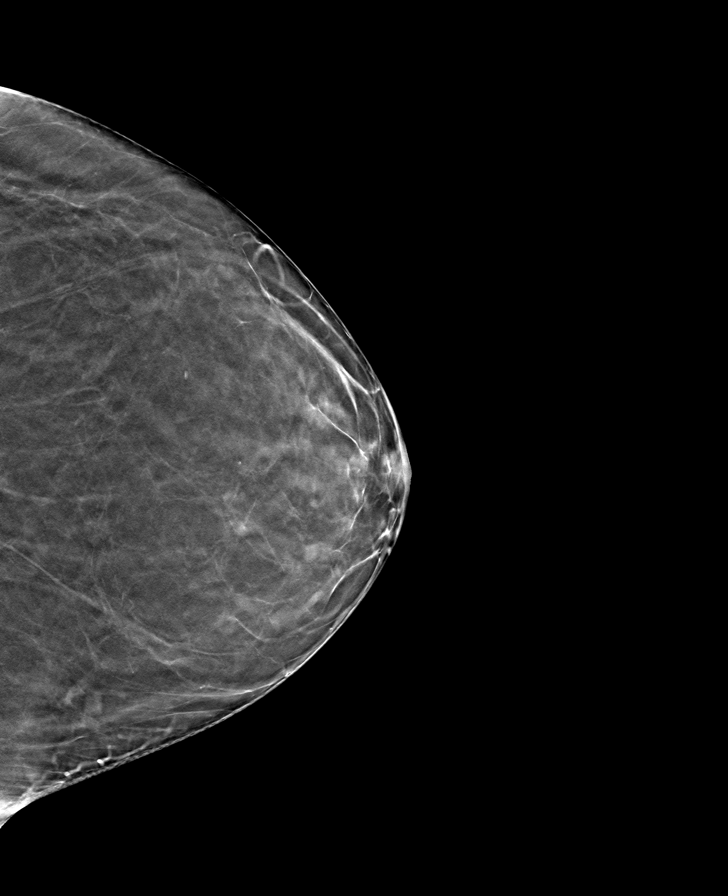

[R CC tomo · tomo slice 29/58.0]
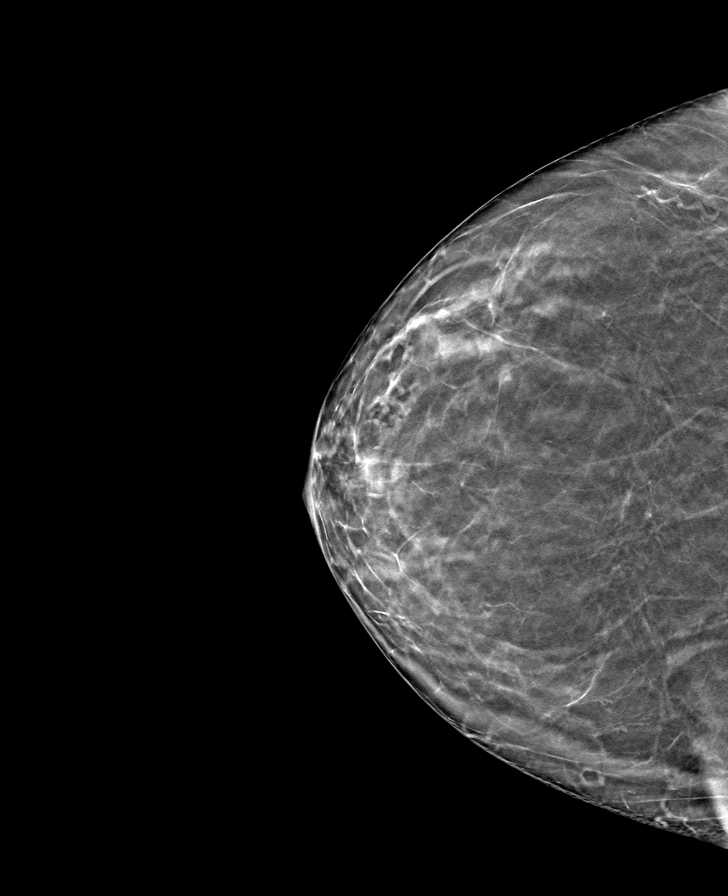

[R MLO tomo · tomo slice 33/64.0]
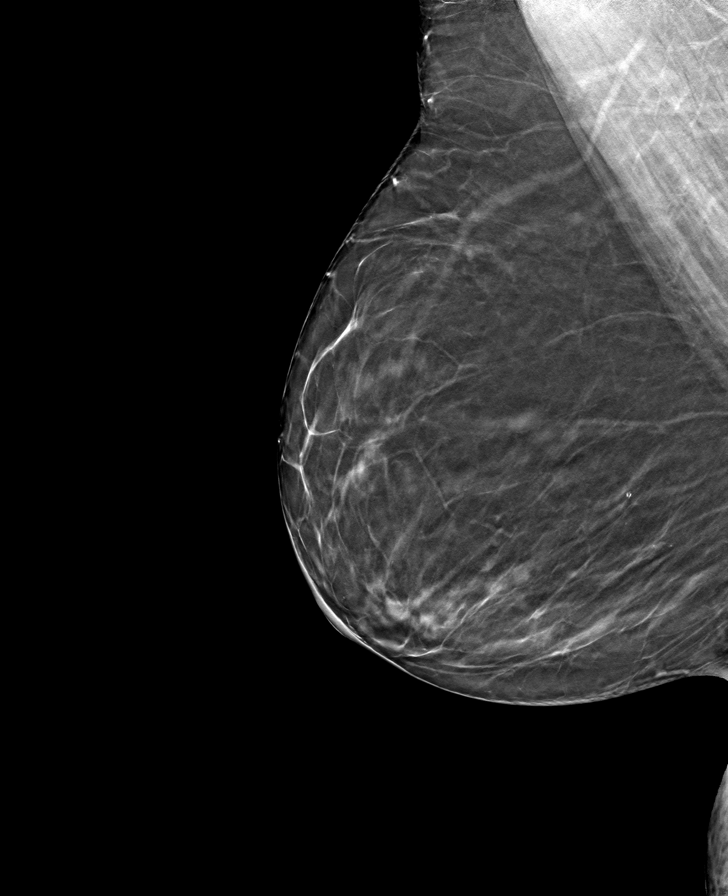

[8 of 24 positions shown; findings below may reference images not displayed]

ACR Breast Density Category b: There are scattered areas of
fibroglandular density.
FINDINGS: There are no findings suspicious for malignancy. Images were
processed with CAD.
IMPRESSION: No mammographic evidence of malignancy. A result letter of this
screening mammogram will be mailed directly to the patient.

RECOMMENDATION:
Screening mammogram in one year. (Code:[id number])

BI-RADS CATEGORY  1: Negative.
